# Patient Record
Sex: Female | Born: 1975 | Race: White | Hispanic: No | Marital: Married | Smoking: Never smoker
Health system: Southern US, Community
[De-identification: ages and names within clinical notes are randomized; demographics above are authoritative.]

## PROBLEM LIST (undated history)

## (undated) DIAGNOSIS — R0981 Nasal congestion: Secondary | ICD-10-CM

## (undated) DIAGNOSIS — E7800 Pure hypercholesterolemia, unspecified: Secondary | ICD-10-CM

## (undated) DIAGNOSIS — Z9189 Other specified personal risk factors, not elsewhere classified: Secondary | ICD-10-CM

## (undated) DIAGNOSIS — I10 Essential (primary) hypertension: Secondary | ICD-10-CM

## (undated) DIAGNOSIS — E559 Vitamin D deficiency, unspecified: Secondary | ICD-10-CM

## (undated) DIAGNOSIS — E785 Hyperlipidemia, unspecified: Secondary | ICD-10-CM

## (undated) DIAGNOSIS — R7303 Prediabetes: Secondary | ICD-10-CM

## (undated) HISTORY — DX: Hyperlipidemia, unspecified: E78.5

## (undated) HISTORY — DX: Prediabetes: R73.03

## (undated) HISTORY — DX: Pure hypercholesterolemia, unspecified: E78.00

## (undated) HISTORY — DX: Other specified personal risk factors, not elsewhere classified: Z91.89

## (undated) HISTORY — DX: Nasal congestion: R09.81

## (undated) HISTORY — DX: Vitamin D deficiency, unspecified: E55.9

---

## 2001-12-21 HISTORY — PX: BREAST BIOPSY: SHX20

## 2005-03-11 ENCOUNTER — Ambulatory Visit: Payer: Self-pay

## 2005-12-21 ENCOUNTER — Observation Stay: Payer: Self-pay | Admitting: Unknown Physician Specialty

## 2006-02-08 ENCOUNTER — Observation Stay: Payer: Self-pay | Admitting: Unknown Physician Specialty

## 2006-02-17 ENCOUNTER — Observation Stay: Payer: Self-pay

## 2006-03-19 ENCOUNTER — Inpatient Hospital Stay: Payer: Self-pay | Admitting: Obstetrics & Gynecology

## 2006-03-25 ENCOUNTER — Ambulatory Visit: Payer: Self-pay | Admitting: Pediatrics

## 2008-07-11 ENCOUNTER — Ambulatory Visit: Payer: Self-pay

## 2011-02-13 ENCOUNTER — Inpatient Hospital Stay: Payer: Self-pay

## 2013-12-29 LAB — HM PAP SMEAR: HM PAP: NEGATIVE

## 2014-02-05 ENCOUNTER — Other Ambulatory Visit: Payer: Self-pay | Admitting: Obstetrics and Gynecology

## 2014-02-05 DIAGNOSIS — Z803 Family history of malignant neoplasm of breast: Secondary | ICD-10-CM

## 2014-02-05 DIAGNOSIS — Z1231 Encounter for screening mammogram for malignant neoplasm of breast: Secondary | ICD-10-CM

## 2014-02-15 ENCOUNTER — Ambulatory Visit: Payer: Self-pay

## 2014-06-25 ENCOUNTER — Ambulatory Visit: Payer: Self-pay | Admitting: Obstetrics and Gynecology

## 2015-12-09 ENCOUNTER — Encounter: Payer: Self-pay | Admitting: *Deleted

## 2015-12-11 ENCOUNTER — Encounter: Payer: Self-pay | Admitting: Obstetrics and Gynecology

## 2015-12-11 ENCOUNTER — Ambulatory Visit (INDEPENDENT_AMBULATORY_CARE_PROVIDER_SITE_OTHER): Payer: BC Managed Care – PPO | Admitting: Obstetrics and Gynecology

## 2015-12-11 VITALS — BP 120/78 | HR 88 | Ht 65.0 in | Wt 208.0 lb

## 2015-12-11 DIAGNOSIS — Z01419 Encounter for gynecological examination (general) (routine) without abnormal findings: Secondary | ICD-10-CM

## 2015-12-11 NOTE — Progress Notes (Signed)
Subjective:   Katherine Callahan is a 39 y.o. 473P0 Caucasian female here for a routine well-woman exam.  Patient's last menstrual period was 11/30/2015 (exact date).    Current complaints: regained weight lost last year, also bumps on 2nd and 3rd digits on left hand getting larger- not painful PCP: me       Does need labs  Social History: Sexual: heterosexual Marital Status: married Living situation: with family Occupation: Runner, broadcasting/film/videoteacher Tobacco/alcohol: no tobacco use Illicit drugs: no history of illicit drug use  The following portions of the patient's history were reviewed and updated as appropriate: allergies, current medications, past family history, past medical history, past social history, past surgical history and problem list.  Past Medical History Past Medical History  Diagnosis Date  . Nasal congestion   . High cholesterol   . Pre-diabetes   . Vitamin D deficiency   . Increased risk of breast cancer     Past Surgical History History reviewed. No pertinent past surgical history.  Gynecologic History G3P0  Patient's last menstrual period was 11/30/2015 (exact date). Contraception: vasectomy Last Pap: 2015. Results were: normal Last mammogram: 2015. Results were: normal  Obstetric History OB History  Gravida Para Term Preterm AB SAB TAB Ectopic Multiple Living  3         3    # Outcome Date GA Lbr Len/2nd Weight Sex Delivery Anes PTL Lv  3 Gravida 2012    F Vag-Spont   Y  2 Gravida 2007    M Vag-Spont   Y  1 Gravida 2001    M Vag-Spont   Y      Current Medications Current Outpatient Prescriptions on File Prior to Visit  Medication Sig Dispense Refill  . ergocalciferol (VITAMIN D2) 50000 UNITS capsule Take 50,000 Units by mouth once a week. Reported on 12/11/2015     No current facility-administered medications on file prior to visit.    Review of Systems Patient denies any headaches, blurred vision, shortness of breath, chest pain, abdominal pain, problems with  bowel movements, urination, or intercourse.  Objective:  BP 120/78 mmHg  Pulse 88  Ht 5\' 5"  (1.651 m)  Wt 208 lb (94.348 kg)  BMI 34.61 kg/m2  LMP 11/30/2015 (Exact Date) Physical Exam  General:  Well developed, well nourished, no acute distress. She is alert and oriented x3. Skin:  Warm and dry Neck:  Midline trachea, no thyromegaly or nodules Cardiovascular: Regular rate and rhythm, no murmur heard Lungs:  Effort normal, all lung fields clear to auscultation bilaterally Breasts:  No dominant palpable mass, retraction, or nipple discharge Abdomen:  Soft, non tender, no hepatosplenomegaly or masses Pelvic:  External genitalia is normal in appearance.  The vagina is normal in appearance. The cervix is bulbous, no CMT.  Thin prep pap is not done. Uterus is felt to be normal size, shape, and contour.  No adnexal masses or tenderness noted. Extremities:  No swelling or varicosities noted Psych:  She has a normal mood and affect  Assessment:   Healthy well-woman exam Obesity Pre-diabetes Nodules on digits  Plan:  Labs obtained Discussed weight loss plan here- will consider Refer to dermatology to establish care F/U 1 year for AE, or sooner if needed Mammogram scheduled  Zacharie Portner [name] [name], CNM

## 2015-12-11 NOTE — Patient Instructions (Signed)
  Place annual gynecologic exam patient instructions here.  Thank you for enrolling in MyChart. Please follow the instructions below to securely access your online medical record. MyChart allows you to send messages to your doctor, view your test results, manage appointments, and more.   How Do I Sign Up? 1. In your Internet browser, go to [employer] Address [employer] and enter [email or web address]. 2. Click on the Sign Up Now link in the Sign In box. You will see the New Member Sign Up page. 3. Enter your MyChart Access Code exactly as it appears below. You will not need to use this code after you've completed the sign-up process. If you do not sign up before the expiration date, you must request a new code.  MyChart Access Code: [email or web address] Expires: 01/24/2016  4:01 PM  4. Enter your Social Security Number ([id number]) and Date of Birth (mm/dd/yyyy) as indicated and click Submit. You will be taken to the next sign-up page. 5. Create a MyChart ID. This will be your MyChart login ID and cannot be changed, so think of one that is secure and easy to remember. 6. Create a MyChart password. You can change your password at any time. 7. Enter your Password Reset Question and Answer. This can be used at a later time if you forget your password.  8. Enter your e-mail address. You will receive e-mail notification when new information is available in MyChart. 9. Click Sign Up. You can now view your medical record.   Additional Information Remember, MyChart is NOT to be used for urgent needs. For medical emergencies, dial 911.

## 2015-12-12 ENCOUNTER — Telehealth: Payer: Self-pay | Admitting: *Deleted

## 2015-12-12 ENCOUNTER — Other Ambulatory Visit: Payer: Self-pay | Admitting: Obstetrics and Gynecology

## 2015-12-12 DIAGNOSIS — E782 Mixed hyperlipidemia: Secondary | ICD-10-CM

## 2015-12-12 LAB — COMPREHENSIVE METABOLIC PANEL
ALBUMIN: 4.6 g/dL (ref 3.5–5.5)
ALK PHOS: 60 IU/L (ref 39–117)
ALT: 20 IU/L (ref 0–32)
AST: 17 IU/L (ref 0–40)
Albumin/Globulin Ratio: 1.7 (ref 1.1–2.5)
BUN / CREAT RATIO: 13 (ref 9–23)
BUN: 9 mg/dL (ref 6–24)
Bilirubin Total: 0.4 mg/dL (ref 0.0–1.2)
CALCIUM: 9.4 mg/dL (ref 8.7–10.2)
CO2: 23 mmol/L (ref 18–29)
CREATININE: 0.69 mg/dL (ref 0.57–1.00)
Chloride: 99 mmol/L (ref 96–106)
GFR calc Af Amer: 125 mL/min/{1.73_m2} (ref >59)
GFR, EST NON AFRICAN AMERICAN: 108 mL/min/{1.73_m2} (ref >59)
GLUCOSE: 89 mg/dL (ref 65–99)
Globulin, Total: 2.7 g/dL (ref 1.5–4.5)
Potassium: 4.2 mmol/L (ref 3.5–5.2)
Sodium: 139 mmol/L (ref 134–144)
Total Protein: 7.3 g/dL (ref 6.0–8.5)

## 2015-12-12 LAB — LIPID PANEL
CHOL/HDL RATIO: 4.4 ratio (ref 0.0–4.4)
CHOLESTEROL TOTAL: 253 mg/dL — AB (ref 100–199)
HDL: 57 mg/dL (ref >39)
LDL CALC: 153 mg/dL — AB (ref 0–99)
TRIGLYCERIDES: 214 mg/dL — AB (ref 0–149)
VLDL CHOLESTEROL CAL: 43 mg/dL — AB (ref 5–40)

## 2015-12-12 LAB — VITAMIN D 25 HYDROXY (VIT D DEFICIENCY, FRACTURES): Vit D, 25-Hydroxy: 24.8 ng/mL — ABNORMAL LOW (ref 30.0–100.0)

## 2015-12-12 LAB — HEMOGLOBIN A1C
Est. average glucose Bld gHb Est-mCnc: 114 mg/dL
Hgb A1c MFr Bld: 5.6 % (ref 4.8–5.6)

## 2015-12-12 MED ORDER — OMEGA-3 1000 MG PO CAPS: 1.0000 | ORAL_CAPSULE | Freq: Every day | ORAL | Status: DC

## 2015-12-12 MED ORDER — RED YEAST RICE 500 MG/0.5GM PO POWD: 1.0000 | Freq: Every day | ORAL | Status: DC

## 2015-12-12 MED ORDER — GARLIC 300 MG PO TABS: 1.0000 | ORAL_TABLET | Freq: Every day | ORAL | Status: DC

## 2015-12-12 NOTE — Telephone Encounter (Signed)
-----   Message from Purcell NailsMelody N Shambley, [place] sent at 12/12/2015  9:20 AM EST ----- Please let her know labs look good except vit D is still low, needs to continue on weekly supplements, especially through the winter. Also cholesterol panel was high with elevated triglycerides. High enough to need medications, but I want tot give her time to clean up diet and weight loss as we discussed, and recheck levels in 3 months (order in computer), if they don't improve with consider prescription medication then. I also sent in prescriptions for OTC natural supplements of Garlic tablets, red yeast rice tablets and Omega 3 capsules as they have been shown to reduce cholesterol I want her to take one of each daily until she gets labs drawn in three months.

## 2015-12-12 NOTE — Telephone Encounter (Signed)
Notified pt of results, she is aware and is going to start dieting and exercising

## 2016-01-23 ENCOUNTER — Ambulatory Visit (INDEPENDENT_AMBULATORY_CARE_PROVIDER_SITE_OTHER): Payer: BC Managed Care – PPO | Admitting: Obstetrics and Gynecology

## 2016-01-23 ENCOUNTER — Encounter: Payer: Self-pay | Admitting: Obstetrics and Gynecology

## 2016-01-23 VITALS — BP 122/78 | HR 98 | Temp 99.1°F | Ht 66.0 in | Wt 213.0 lb

## 2016-01-23 DIAGNOSIS — J209 Acute bronchitis, unspecified: Secondary | ICD-10-CM

## 2016-01-23 MED ORDER — ALBUTEROL SULFATE HFA 108 (90 BASE) MCG/ACT IN AERS: 2.0000 | INHALATION_SPRAY | Freq: Four times a day (QID) | RESPIRATORY_TRACT | Status: DC | PRN

## 2016-01-23 MED ORDER — CEFDINIR 300 MG PO CAPS: 300.0000 mg | ORAL_CAPSULE | Freq: Two times a day (BID) | ORAL | Status: DC

## 2016-01-23 NOTE — Patient Instructions (Signed)

## 2016-01-23 NOTE — Progress Notes (Signed)
Subjective:     Patient ID: Katherine Callahan, female   DOB: [birth year], 40 y.o.   MRN: [id number]  HPI Reports sudden onset productive cough and generalized fatigue 3 days ago, now with dry cough, postnasal drip and rib pain in upper right back from coughing. Denies fever.  Review of Systems See above    Objective:   Physical Exam A&O x4 Well groomed female HRR Lungs with bilateral rhonchi- worse on right Negative lymphadenopathy    Assessment:     Acute bronchitis     Plan:     Rest & push fluids. RX for [employer] ProAir inhaler sent in RTC prn  Oanh Devivo [place], CNM

## 2016-03-31 ENCOUNTER — Ambulatory Visit: Payer: Self-pay

## 2016-04-01 ENCOUNTER — Ambulatory Visit
Admission: RE | Admit: 2016-04-01 | Discharge: 2016-04-01 | Disposition: A | Discharge status: Home / Self Care | Payer: BC Managed Care – PPO | Source: Ambulatory Visit | Attending: Obstetrics and Gynecology | Admitting: Obstetrics and Gynecology

## 2016-04-01 DIAGNOSIS — Z1231 Encounter for screening mammogram for malignant neoplasm of breast: Secondary | ICD-10-CM | POA: Insufficient documentation

## 2016-04-01 DIAGNOSIS — Z01419 Encounter for gynecological examination (general) (routine) without abnormal findings: Secondary | ICD-10-CM

## 2016-04-02 ENCOUNTER — Other Ambulatory Visit: Payer: Self-pay | Admitting: Obstetrics and Gynecology

## 2016-04-02 DIAGNOSIS — R928 Other abnormal and inconclusive findings on diagnostic imaging of breast: Secondary | ICD-10-CM

## 2016-04-07 ENCOUNTER — Ambulatory Visit
Admission: RE | Admit: 2016-04-07 | Discharge: 2016-04-07 | Disposition: A | Discharge status: Home / Self Care | Payer: BC Managed Care – PPO | Source: Ambulatory Visit | Attending: Obstetrics and Gynecology | Admitting: Obstetrics and Gynecology

## 2016-04-07 DIAGNOSIS — R928 Other abnormal and inconclusive findings on diagnostic imaging of breast: Secondary | ICD-10-CM

## 2016-04-07 DIAGNOSIS — N6002 Solitary cyst of left breast: Secondary | ICD-10-CM | POA: Insufficient documentation

## 2016-04-09 ENCOUNTER — Other Ambulatory Visit: Payer: BC Managed Care – PPO

## 2016-04-09 ENCOUNTER — Ambulatory Visit: Payer: BC Managed Care – PPO

## 2016-05-19 ENCOUNTER — Other Ambulatory Visit: Payer: Self-pay | Admitting: *Deleted

## 2016-05-19 ENCOUNTER — Telehealth: Payer: Self-pay | Admitting: Obstetrics and Gynecology

## 2016-05-19 MED ORDER — SCOPOLAMINE 1 MG/3DAYS TD PT72: 1.0000 | MEDICATED_PATCH | TRANSDERMAL | Status: DC

## 2016-05-19 NOTE — Telephone Encounter (Signed)
Pt is going on a cruise and wants the patch for motion sickness sent to [facility] [place]

## 2016-05-19 NOTE — Telephone Encounter (Signed)
Done-ac 

## 2016-05-29 ENCOUNTER — Telehealth: Payer: Self-pay | Admitting: *Deleted

## 2016-05-29 ENCOUNTER — Other Ambulatory Visit: Payer: Self-pay | Admitting: Obstetrics and Gynecology

## 2016-05-29 MED ORDER — AZITHROMYCIN 250 MG PO TABS: ORAL_TABLET | ORAL | Status: DC

## 2016-05-29 NOTE — Telephone Encounter (Signed)
Patient called and states that her son had strep throat last week and she is complaining of a sore throat. Patient is leaving for her cruise today and is wondering if Melody can call in a Z pack for her. [facility] [place]. Thanks

## 2016-05-29 NOTE — Telephone Encounter (Signed)
done

## 2016-12-08 ENCOUNTER — Other Ambulatory Visit: Payer: Self-pay | Admitting: *Deleted

## 2016-12-08 ENCOUNTER — Telehealth: Payer: Self-pay | Admitting: Obstetrics and Gynecology

## 2016-12-08 MED ORDER — AZITHROMYCIN 250 MG PO TABS: ORAL_TABLET | ORAL | 1 refills | Status: DC

## 2016-12-08 NOTE — Telephone Encounter (Signed)
PT CALLED AND REQUESTED A CALL BACK FROM YOU/

## 2016-12-08 NOTE — Telephone Encounter (Signed)
Spoke with pt

## 2016-12-16 ENCOUNTER — Encounter: Payer: Self-pay | Admitting: Obstetrics and Gynecology

## 2016-12-16 ENCOUNTER — Ambulatory Visit (INDEPENDENT_AMBULATORY_CARE_PROVIDER_SITE_OTHER): Payer: BC Managed Care – PPO | Admitting: Obstetrics and Gynecology

## 2016-12-16 VITALS — BP 120/84 | HR 75 | Ht 66.0 in | Wt 213.3 lb

## 2016-12-16 DIAGNOSIS — Z23 Encounter for immunization: Secondary | ICD-10-CM | POA: Diagnosis not present

## 2016-12-16 DIAGNOSIS — Z01419 Encounter for gynecological examination (general) (routine) without abnormal findings: Secondary | ICD-10-CM | POA: Diagnosis not present

## 2016-12-16 NOTE — Patient Instructions (Addendum)
 Preventive Care 18-39 Years, Female Preventive care refers to lifestyle choices and visits with your health care provider that can promote health and wellness. What does preventive care include?  A yearly physical exam. This is also called an annual well check.  Dental exams once or twice a year.  Routine eye exams. Ask your health care provider how often you should have your eyes checked.  Personal lifestyle choices, including:  Daily care of your teeth and gums.  Regular physical activity.  Eating a healthy diet.  Avoiding tobacco and drug use.  Limiting alcohol use.  Practicing safe sex.  Taking vitamin and mineral supplements as recommended by your health care provider. What happens during an annual well check? The services and screenings done by your health care provider during your annual well check will depend on your age, overall health, lifestyle risk factors, and family history of disease. Counseling  Your health care provider may ask you questions about your:  Alcohol use.  Tobacco use.  Drug use.  Emotional well-being.  Home and relationship well-being.  Sexual activity.  Eating habits.  Work and work environment.  Method of birth control.  Menstrual cycle.  Pregnancy history. Screening  You may have the following tests or measurements:  Height, weight, and BMI.  Diabetes screening. This is done by checking your blood sugar (glucose) after you have not eaten for a while (fasting).  Blood pressure.  Lipid and cholesterol levels. These may be checked every 5 years starting at age 20.  Skin check.  Hepatitis C blood test.  Hepatitis B blood test.  Sexually transmitted disease (STD) testing.  BRCA-related cancer screening. This may be done if you have a family history of breast, ovarian, tubal, or peritoneal cancers.  Pelvic exam and Pap test. This may be done every 3 years starting at age 21. Starting at age 30, this may be done  every 5 years if you have a Pap test in combination with an HPV test. Discuss your test results, treatment options, and if necessary, the need for more tests with your health care provider. Vaccines  Your health care provider may recommend certain vaccines, such as:  Influenza vaccine. This is recommended every year.  Tetanus, diphtheria, and acellular pertussis (Tdap, Td) vaccine. You may need a Td booster every 10 years.  Varicella vaccine. You may need this if you have not been vaccinated.  HPV vaccine. If you are 26 or younger, you may need three doses over 6 months.  Measles, mumps, and rubella (MMR) vaccine. You may need at least one dose of MMR. You may also need a second dose.  Pneumococcal 13-valent conjugate (PCV13) vaccine. You may need this if you have certain conditions and were not previously vaccinated.  Pneumococcal polysaccharide (PPSV23) vaccine. You may need one or two doses if you smoke cigarettes or if you have certain conditions.  Meningococcal vaccine. One dose is recommended if you are age 19-21 years and a first-year college student living in a residence hall, or if you have one of several medical conditions. You may also need additional booster doses.  Hepatitis A vaccine. You may need this if you have certain conditions or if you travel or work in places where you may be exposed to hepatitis A.  Hepatitis B vaccine. You may need this if you have certain conditions or if you travel or work in places where you may be exposed to hepatitis B.  Haemophilus influenzae type b (Hib) vaccine. You may need   this if you have certain risk factors. Talk to your health care provider about which screenings and vaccines you need and how often you need them. This information is not intended to replace advice given to you by your health care provider. Make sure you discuss any questions you have with your health care provider. Document Released: 02/02/2002 Document Revised:  08/26/2016 Document Reviewed: 10/08/2015 Elsevier Interactive Patient Education  2017 [place] you for enrolling in [place]. Please follow the instructions below to securely access your online medical record. MyChart allows you to send messages to your doctor, view your test results, renew your prescriptions, schedule appointments, and more.  How Do I Sign Up? 1. In your Internet browser, go to [email or web address]. 2. Click on the New  User? link in the Sign In box.  3. Enter your MyChart Access Code exactly as it appears below. You will not need to use this code after you have completed the sign-up process. If you do not sign up before the expiration date, you must request a new code. MyChart Access Code: [email or web address] Expires: 02/14/2017  8:24 AM  4. Enter the last four digits of your Social Security Number (xxxx) and Date of Birth (mm/dd/yyyy) as indicated and click Next. You will be taken to the next sign-up page. 5. Create a MyChart ID. This will be your MyChart login ID and cannot be changed, so think of one that is secure and easy to remember. 6. Create a MyChart password. You can change your password at any time. 7. Enter your Password Reset Question and Answer and click Next. This can be used at a later time if you forget your password.  8. Select your communication preference, and if applicable enter your e-mail address. You will receive e-mail notification when new information is available in MyChart by choosing to receive e-mail notifications and filling in your e-mail. 9. Click Sign In. You can now view your medical record.   Additional Information If you have questions, you can email [email or web address] or call [phone] to talk to our [place] staff. Remember, MyChart is NOT to be used for urgent needs. For medical emergencies, dial 911.

## 2016-12-16 NOTE — Progress Notes (Signed)
Subjective:   Katherine Callahan is a 40 y.o. 193P0 Caucasian female here for a routine well-woman exam.  Patient's last menstrual period was 12/06/2016.    Current complaints: tired all the time, desires restart of weight loss program PCP: me       does desire labs  Social History: Sexual: heterosexual Marital Status: married Living situation: with family Occupation: Runner, broadcasting/film/videoteacher Tobacco/alcohol: no tobacco use Illicit drugs: no history of illicit drug use  The following portions of the patient's history were reviewed and updated as appropriate: allergies, current medications, past family history, past medical history, past social history, past surgical history and problem list.  Past Medical History Past Medical History:  Diagnosis Date  . High cholesterol   . Increased risk of breast cancer   . Nasal congestion   . Pre-diabetes   . Vitamin D deficiency     Past Surgical History Past Surgical History:  Procedure Laterality Date  . BREAST BIOPSY     neg    Gynecologic History G3P0  Patient's last menstrual period was 12/06/2016. Contraception: tubal ligation Last Pap: 2015. Results were: normal Last mammogram: 2017. Results were: abnormal   Obstetric History OB History  Gravida Para Term Preterm AB Living  3         3  SAB TAB Ectopic Multiple Live Births          3    # Outcome Date GA Lbr Len/2nd Weight Sex Delivery Anes PTL Lv  3 Gravida 2012    F Vag-Spont   LIV  2 Gravida 2007    M Vag-Spont   LIV  1 Gravida 2001    M Vag-Spont   LIV      Current Medications Current Outpatient Prescriptions on File Prior to Visit  Medication Sig Dispense Refill  . azithromycin (ZITHROMAX) 250 MG tablet Take as directed: Two pills by mouth the first [place], then one pill every [place] until completed (Patient not taking: Reported on 12/16/2016) 6 tablet 1  . cefdinir (OMNICEF) 300 MG capsule Take 1 capsule (300 mg total) by mouth 2 (two) times daily. (Patient not taking: Reported on  12/16/2016) 14 capsule 1  . ergocalciferol (VITAMIN D2) 50000 UNITS capsule Take 50,000 Units by mouth once a week. Reported on 12/11/2015    . Garlic 300 MG TABS Take 1 tablet (300 mg total) by mouth daily. (Patient not taking: Reported on 12/16/2016) 120 each 1  . Omega-3 1000 MG CAPS Take 1 capsule (1,000 mg total) by mouth daily. (Patient not taking: Reported on 12/16/2016) 120 capsule 1  . Red Yeast Rice 500 MG/0.5GM POWD Take 1 capsule by mouth daily. (Patient not taking: Reported on 12/16/2016) 120 Bottle 1  . scopolamine (TRANSDERM-SCOP, 1.5 MG,) 1 MG/3DAYS Place 1 patch (1.5 mg total) onto the skin every 3 (three) days. (Patient not taking: Reported on 12/16/2016) 5 patch 0   No current facility-administered medications on file prior to visit.     Review of Systems Patient denies any headaches, blurred vision, shortness of breath, chest pain, abdominal pain, problems with bowel movements, urination, or intercourse.  Objective:  BP 120/84   Pulse 75   Ht 5\' 6"  (1.676 m)   Wt 213 lb 4.8 oz (96.8 kg)   LMP 12/06/2016   BMI 34.43 kg/m  Physical Exam  General:  Well developed, well nourished, no acute distress. She is alert and oriented x3. Skin:  Warm and dry Neck:  Midline trachea, no thyromegaly or nodules Cardiovascular: Regular  rate and rhythm, no murmur heard Lungs:  Effort normal, all lung fields clear to auscultation bilaterally Breasts:  No dominant palpable mass, retraction, or nipple discharge Abdomen:  Soft, non tender, no hepatosplenomegaly or masses Pelvic:  External genitalia is normal in appearance.  The vagina is normal in appearance. The cervix is bulbous, no CMT.  Thin prep pap is not done. Uterus is felt to be normal size, shape, and contour.  No adnexal masses or tenderness noted. Extremities:  No swelling or varicosities noted Psych:  She has a normal mood and affect  Assessment:   Healthy well-woman exam Needs flu vaccine Elevated cholesterol Vit D  deficiency Obesity   Plan:  Returning tomorrow for labs and to restart weight loss program F/U 1 year for AE, or sooner if needed Mammogram ordered  Melody [name] Shambley, CNM

## 2016-12-17 ENCOUNTER — Other Ambulatory Visit: Payer: BC Managed Care – PPO

## 2016-12-18 ENCOUNTER — Other Ambulatory Visit: Payer: Self-pay | Admitting: Obstetrics and Gynecology

## 2016-12-18 DIAGNOSIS — R7989 Other specified abnormal findings of blood chemistry: Secondary | ICD-10-CM

## 2016-12-18 LAB — COMPREHENSIVE METABOLIC PANEL
ALK PHOS: 68 IU/L (ref 39–117)
ALT: 25 IU/L (ref 0–32)
AST: 20 IU/L (ref 0–40)
Albumin/Globulin Ratio: 1.5 (ref 1.2–2.2)
Albumin: 4.2 g/dL (ref 3.5–5.5)
BILIRUBIN TOTAL: 0.5 mg/dL (ref 0.0–1.2)
BUN/Creatinine Ratio: 15 (ref 9–23)
BUN: 11 mg/dL (ref 6–24)
CHLORIDE: 100 mmol/L (ref 96–106)
CO2: 24 mmol/L (ref 18–29)
Calcium: 9.3 mg/dL (ref 8.7–10.2)
Creatinine, Ser: 0.74 mg/dL (ref 0.57–1.00)
GFR calc Af Amer: 116 mL/min/{1.73_m2} (ref >59)
GFR calc non Af Amer: 100 mL/min/{1.73_m2} (ref >59)
GLUCOSE: 88 mg/dL (ref 65–99)
Globulin, Total: 2.8 g/dL (ref 1.5–4.5)
Potassium: 4.4 mmol/L (ref 3.5–5.2)
Sodium: 139 mmol/L (ref 134–144)
Total Protein: 7 g/dL (ref 6.0–8.5)

## 2016-12-18 LAB — THYROID PANEL WITH TSH
FREE THYROXINE INDEX: 1.3 (ref 1.2–4.9)
T3 UPTAKE RATIO: 21 % — AB (ref 24–39)
T4 TOTAL: 6.4 ug/dL (ref 4.5–12.0)
TSH: 5.51 u[IU]/mL — ABNORMAL HIGH (ref 0.450–4.500)

## 2016-12-18 LAB — CBC
Hematocrit: 44.1 % (ref 34.0–46.6)
Hemoglobin: 14.8 g/dL (ref 11.1–15.9)
MCH: 29.7 pg (ref 26.6–33.0)
MCHC: 33.6 g/dL (ref 31.5–35.7)
MCV: 89 fL (ref 79–97)
PLATELETS: 259 10*3/uL (ref 150–379)
RBC: 4.98 x10E6/uL (ref 3.77–5.28)
RDW: 13.6 % (ref 12.3–15.4)
WBC: 6.8 10*3/uL (ref 3.4–10.8)

## 2016-12-18 LAB — NMR, LIPOPROFILE
Cholesterol: 253 mg/dL — ABNORMAL HIGH (ref 100–199)
HDL CHOLESTEROL BY NMR: 57 mg/dL (ref >39)
HDL Particle Number: 43.7 umol/L (ref >=30.5)
LDL PARTICLE NUMBER: 2082 nmol/L — AB (ref <1000)
LDL Size: 20.7 nm (ref >20.5)
LDL-C: 141 mg/dL — AB (ref 0–99)
LP-IR Score: 79 — ABNORMAL HIGH (ref <=45)
Small LDL Particle Number: 1073 nmol/L — ABNORMAL HIGH (ref <=527)
TRIGLYCERIDES BY NMR: 275 mg/dL — AB (ref 0–149)

## 2016-12-18 LAB — VITAMIN B12: VITAMIN B 12: 333 pg/mL (ref 232–1245)

## 2016-12-18 LAB — HEMOGLOBIN A1C
Est. average glucose Bld gHb Est-mCnc: 111 mg/dL
HEMOGLOBIN A1C: 5.5 % (ref 4.8–5.6)

## 2016-12-18 LAB — VITAMIN D 25 HYDROXY (VIT D DEFICIENCY, FRACTURES): Vit D, 25-Hydroxy: 23.4 ng/mL — ABNORMAL LOW (ref 30.0–100.0)

## 2016-12-18 MED ORDER — ROSUVASTATIN CALCIUM 10 MG PO TABS: 10.0000 mg | ORAL_TABLET | Freq: Every day | ORAL | 6 refills | Status: DC

## 2017-01-04 ENCOUNTER — Telehealth: Payer: Self-pay | Admitting: Obstetrics and Gynecology

## 2017-01-04 NOTE — Telephone Encounter (Signed)
Pt called and she never got a call back about her blood work, she stated she does not have my chart and then her pharmacy called her and said her crestor was ready, and she is wanting to know the results about her blood.

## 2017-01-08 NOTE — Telephone Encounter (Signed)
-----   Message from Purcell NailsMelody N Shambley, [place] sent at 12/18/2016 11:47 AM EST ----- See note

## 2017-01-08 NOTE — Telephone Encounter (Signed)
Done-ac 

## 2017-01-08 NOTE — Telephone Encounter (Signed)
Notified pt of results 

## 2017-01-22 ENCOUNTER — Other Ambulatory Visit (INDEPENDENT_AMBULATORY_CARE_PROVIDER_SITE_OTHER): Payer: BC Managed Care – PPO

## 2017-01-22 ENCOUNTER — Ambulatory Visit (INDEPENDENT_AMBULATORY_CARE_PROVIDER_SITE_OTHER): Payer: BC Managed Care – PPO | Admitting: Internal Medicine

## 2017-01-22 ENCOUNTER — Encounter: Payer: Self-pay | Admitting: Internal Medicine

## 2017-01-22 VITALS — BP 112/80 | HR 97 | Ht 65.5 in | Wt 214.0 lb

## 2017-01-22 DIAGNOSIS — R7989 Other specified abnormal findings of blood chemistry: Secondary | ICD-10-CM

## 2017-01-22 DIAGNOSIS — R946 Abnormal results of thyroid function studies: Secondary | ICD-10-CM

## 2017-01-22 LAB — T4, FREE: FREE T4: 0.52 ng/dL — AB (ref 0.60–1.60)

## 2017-01-22 LAB — T3, FREE: T3, Free: 4.5 pg/mL — ABNORMAL HIGH (ref 2.3–4.2)

## 2017-01-22 LAB — TSH: TSH: 3.18 u[IU]/mL (ref 0.35–4.50)

## 2017-01-22 NOTE — Patient Instructions (Signed)
[facility]  If we need to start thyroid hormone, take the thyroid hormone every [place], with water, at least 30 minutes before breakfast, separated by at least 4 hours from: - acid reflux medications - calcium - iron - multivitamins  Please come back for a follow-up appointment in 4 months.

## 2017-01-22 NOTE — Progress Notes (Signed)
Patient ID: Katherine Callahan, female   DOB: 01/17/1976, 41 y.o.   MRN: [id number]    HPI  Katherine Callahan is a 41 y.o.-year-old female, referred by Katherine Callahan ([facility]), for management of elevated TSH.  I reviewed pt's thyroid tests: Lab Results  Component Value Date   TSH 5.510 (H) 12/17/2016    Pt describes: - + weight gain, but lost weight 30 lbs 3 years ago. - + fatigue - no cold intolerance - no depression - + occas.constipation - no dry skin - + chronic hair loss - worse  She is exercises 30 min 2x a week.   Pt denies feeling nodules in neck, hoarseness, dysphagia/odynophagia, SOB with lying down.  She has no FH of thyroid disorders. No FH of thyroid cancer.  No h/o radiation tx to head or neck. No recent use of iodine supplements.  Pt. also has a history of HL, migraines - uses peppermint oil.  ROS: Constitutional: + see HPI Eyes: no blurry vision, no xerophthalmia ENT: no sore throat, no nodules palpated in throat, no dysphagia/odynophagia, no hoarseness Cardiovascular: no CP/SOB/palpitations/leg swelling Respiratory: no cough/SOB Gastrointestinal: no N/V/D/C/+ heartburn Musculoskeletal: no muscle/joint aches Skin: no rashes Neurological: no tremors/numbness/tingling/dizziness, + HA Psychiatric: no depression/anxiety + low libido  Past Medical History:  Diagnosis Date  . High cholesterol   . Increased risk of breast cancer   . Nasal congestion   . Pre-diabetes   . Vitamin D deficiency    Past Surgical History:  Procedure Laterality Date  . BREAST BIOPSY     neg   Social History   Social History  . Marital status: Married    Spouse name: N/A  . Number of children: 3   Occupational History  . teacher   Social History Main Topics  . Smoking status: Never Smoker  . Smokeless tobacco: Never Used  . Alcohol use No  . Drug use: No  . Sexual activity: Yes     Comment: husband-vasectomy   Current Outpatient Prescriptions on File Prior to  Visit  Medication Sig Dispense Refill  . ergocalciferol (VITAMIN D2) 50000 UNITS capsule Take 50,000 Units by mouth once a week. Reported on 12/11/2015    . cefdinir (OMNICEF) 300 MG capsule Take 1 capsule (300 mg total) by mouth 2 (two) times daily. (Patient not taking: Reported on 12/16/2016) 14 capsule 1  . Garlic 300 MG TABS Take 1 tablet (300 mg total) by mouth daily. (Patient not taking: Reported on 12/16/2016) 120 each 1  . Omega-3 1000 MG CAPS Take 1 capsule (1,000 mg total) by mouth daily. (Patient not taking: Reported on 12/16/2016) 120 capsule 1  . Red Yeast Rice 500 MG/0.5GM POWD Take 1 capsule by mouth daily. (Patient not taking: Reported on 12/16/2016) 120 Bottle 1  . rosuvastatin (CRESTOR) 10 MG tablet Take 1 tablet (10 mg total) by mouth daily. (Patient not taking: Reported on 01/22/2017) 30 tablet 6  . scopolamine (TRANSDERM-SCOP, 1.5 MG,) 1 MG/3DAYS Place 1 patch (1.5 mg total) onto the skin every 3 (three) days. (Patient not taking: Reported on 12/16/2016) 5 patch 0   No current facility-administered medications on file prior to visit.    Allergies  Allergen Reactions  . Aspirin   . Codeine   . Sulfa Antibiotics   . Penicillin G Rash   Family History  Problem Relation Age of Onset  . Breast cancer Other   . Cancer Mother     breast  . Breast cancer Mother 15  .  Heart disease Father     PE: BP 112/80 (BP Location: Left Arm, Patient Position: Sitting)   Pulse 97   Ht 5' 5.5" (1.664 m)   Wt 214 lb (97.1 kg)   LMP 12/31/2016   SpO2 97%   BMI 35.07 kg/m  Wt Readings from Last 3 Encounters:  01/22/17 214 lb (97.1 kg)  12/16/16 213 lb 4.8 oz (96.8 kg)  01/23/16 213 lb (96.6 kg)   Constitutional: overweight, in NAD Eyes: PERRLA, EOMI, no exophthalmos ENT: moist mucous membranes, no thyromegaly, no cervical lymphadenopathy Cardiovascular: RRR, No MRG Respiratory: CTA B Gastrointestinal: abdomen soft, NT, ND, BS+ Musculoskeletal: no deformities, strength intact  in all 4 Skin: moist, warm, no rashes Neurological: no tremor with outstretched hands, DTR normal in all 4  ASSESSMENT: 1. High TSH  PLAN:  1. Patient with newly found high TSH level, on annual screening labs. Retrospectively, patient does notice fatigue and difficulty to lose weight. - we had a long discussion about her what the high TSH level means, we discussed about the pituitary-thyroid axis and also possible causes for hypothyroidism. I advised her that Hashimoto thyroiditis is the most common cause for hypothyroidism in [place] and it is possible that she has this. I explained that this is an autoimmune disorder, in which she develops antibodies against her own thyroid. The antibodies bind to the thyroid tissue and cause inflammation, and, eventually, destruction of the gland and hypothyroidism. We don't know how long this process can be, it can last from months to years. We will check her thyroid function tests today, and I will also add thyroid antibody levels to screen her for Hashimoto's disease - We did discuss about subclinical hypothyroidism: Definition, possible symptoms, and treatment criteria. I explained that this is a condition that is usually treated if the TSH is higher than 10, if the patient desires a pregnancy, or if the TSH is above normal but the patient has signs and symptoms of hypothyroidism. Since she does have fatigue and difficulty losing weight, she feels that she may benefit from a low dose of levothyroxine. - We discussed about how to take the medicine correctly if we need to start it. I advised her to take the thyroid hormone every [place], with water, at least 30 minutes before breakfast, separated by at least 4 hours from: - acid reflux medications - calcium - iron - multivitamins - I advised pt to join my chart and I will send her the results through there   Orders Placed This Encounter  Procedures  . TSH  . T4, free  . T3, free  . Thyroid peroxidase antibody  .  Thyroglobulin antibody   Component     Latest Ref Rng & Units 12/17/2016 01/22/2017  TSH     0.35 - 4.50 uIU/mL 5.510 (H) 3.18  T4,Free(Direct)     0.60 - 1.60 ng/dL  [id number] (L)  Triiodothyronine,Free,Serum     2.3 - 4.2 pg/mL  4.5 (H)  Thyroperoxidase Ab SerPl-aCnc     <9 IU/mL  259 (H)  Thyroglobulin Ab     <2 IU/mL  1   Thyroid antibodies are elevated, consistent with Hashimoto thyroiditis. The TSH is now normal with a slightly low free T4 and a slightly high free T3. Therefore, I would recommend to recheck the tests in 1.5 months and decide about starting treatment then.  Carlus Pavlovristina Heavenleigh Petruzzi, MD PhD [facility]

## 2017-01-23 LAB — THYROID PEROXIDASE ANTIBODY: Thyroperoxidase Ab SerPl-aCnc: 259 IU/mL — ABNORMAL HIGH (ref <9)

## 2017-01-23 LAB — THYROGLOBULIN ANTIBODY: THYROGLOBULIN AB: 1 [IU]/mL (ref <2)

## 2017-01-26 ENCOUNTER — Telehealth: Payer: Self-pay

## 2017-01-26 NOTE — Telephone Encounter (Signed)
-----   Message from Carlus Pavlovristina Gherghe, MD sent at 01/25/2017 12:55 PM EST ----- Raynelle FanningJulie, can you please call pt: Katherine Callahan are elevated, consistent with Hashimoto thyroiditis. The TSH is now normal with a slightly low free T4 and a slightly high free T3. This is an unusual picture, without clear hypo-thyroidism, and therefore, I would recommend to recheck the tests in 1.5 months and decide about starting treatment then. Labs are in.

## 2017-01-26 NOTE — Telephone Encounter (Signed)
Called and LVM for patient to call back to receive lab results. Gave call back number.

## 2017-01-27 ENCOUNTER — Other Ambulatory Visit: Payer: Self-pay

## 2017-01-27 DIAGNOSIS — E063 Autoimmune thyroiditis: Secondary | ICD-10-CM

## 2017-02-09 ENCOUNTER — Telehealth: Payer: Self-pay

## 2017-02-09 ENCOUNTER — Other Ambulatory Visit: Payer: Self-pay | Admitting: Obstetrics and Gynecology

## 2017-02-09 NOTE — Telephone Encounter (Signed)
Called home phone, LVM for patient to call back to receive lab results and to make appointment in 1.5 month.

## 2017-02-09 NOTE — Telephone Encounter (Signed)
-----   Message from Cristina Gherghe, MD sent at 01/25/2017 12:55 PM EST ----- Rolinda Impson, can you please call pt: Thyroid antibodies are elevated, consistent with Hashimoto thyroiditis. The TSH is now normal with a slightly low free T4 and a slightly high free T3. This is an unusual picture, without clear hypo-thyroidism, and therefore, I would recommend to recheck the tests in 1.5 months and decide about starting treatment then. Labs are in. 

## 2017-03-03 ENCOUNTER — Telehealth: Payer: Self-pay

## 2017-03-03 NOTE — Telephone Encounter (Signed)
-----   Message from Carlus Pavlovristina Gherghe, MD sent at 01/25/2017 12:55 PM EST ----- Raynelle FanningJulie, can you please call pt: Thyroid antibodies are elevated, consistent with Hashimoto thyroiditis. The TSH is now normal with a slightly low free T4 and a slightly high free T3. This is an unusual picture, without clear hypo-thyroidism, and therefore, I would recommend to recheck the tests in 1.5 months and decide about starting treatment then. Labs are in.

## 2017-03-03 NOTE — Telephone Encounter (Signed)
Called and spoke with husband, advised that we had tried to contact her multiple times, and LVM. He stated he would take a message and get her to call back regarding lab results.

## 2017-03-03 NOTE — Telephone Encounter (Signed)
Called patient and gave lab results. Patient had no questions or concerns.  

## 2017-03-04 ENCOUNTER — Other Ambulatory Visit: Payer: BC Managed Care – PPO

## 2017-03-04 ENCOUNTER — Other Ambulatory Visit: Payer: Self-pay | Admitting: Internal Medicine

## 2017-03-05 ENCOUNTER — Encounter: Payer: Self-pay | Admitting: Internal Medicine

## 2017-03-05 ENCOUNTER — Other Ambulatory Visit: Payer: Self-pay | Admitting: Internal Medicine

## 2017-03-05 DIAGNOSIS — E039 Hypothyroidism, unspecified: Secondary | ICD-10-CM

## 2017-03-05 DIAGNOSIS — E038 Other specified hypothyroidism: Secondary | ICD-10-CM

## 2017-03-05 DIAGNOSIS — E063 Autoimmune thyroiditis: Secondary | ICD-10-CM

## 2017-03-05 LAB — TSH: TSH: 5.84 u[IU]/mL — AB (ref 0.450–4.500)

## 2017-03-05 LAB — T4, FREE: Free T4: 0.9 ng/dL (ref 0.82–1.77)

## 2017-03-05 LAB — T3, FREE: T3 FREE: 3 pg/mL (ref 2.0–4.4)

## 2017-03-08 ENCOUNTER — Other Ambulatory Visit: Payer: Self-pay

## 2017-03-08 DIAGNOSIS — R7989 Other specified abnormal findings of blood chemistry: Secondary | ICD-10-CM

## 2017-03-08 MED ORDER — LEVOTHYROXINE SODIUM 25 MCG PO TABS: 25.0000 ug | ORAL_TABLET | Freq: Every day | ORAL | 2 refills | Status: DC

## 2017-06-01 ENCOUNTER — Telehealth: Payer: Self-pay | Admitting: Internal Medicine

## 2017-06-01 ENCOUNTER — Telehealth: Payer: Self-pay

## 2017-06-01 NOTE — Telephone Encounter (Signed)
Called patient and advised her to go have labs drawn and then we would get the results and let her know if medication needs to stay the same. Patient understood.  

## 2017-06-01 NOTE — Telephone Encounter (Signed)
Please advise. Thank you

## 2017-06-01 NOTE — Telephone Encounter (Signed)
ok 

## 2017-06-01 NOTE — Telephone Encounter (Signed)
Called patient and advised her to go have labs drawn and then we would get the results and let her know if medication needs to stay the same. Patient understood.

## 2017-06-01 NOTE — Telephone Encounter (Signed)
Patient had to reschedule her appointment scheduled for tomorrow to 08-25-17 at 11am due to work. Patient needed to know if Dr. Elvera LennoxGherghe wants her to have labs done before then and if she would still refill her levothyroxine (SYNTHROID, LEVOTHROID) 25 MCG tablet before she leaves this week. Call patient to advise today. Patient is awaiting call. Okay to leave a detailed message on mobile.

## 2017-06-01 NOTE — Telephone Encounter (Signed)
yes

## 2017-06-01 NOTE — Telephone Encounter (Signed)
Should the patient have labs done?

## 2017-06-02 ENCOUNTER — Other Ambulatory Visit: Payer: BC Managed Care – PPO

## 2017-06-02 ENCOUNTER — Ambulatory Visit: Payer: BC Managed Care – PPO | Admitting: Internal Medicine

## 2017-06-02 DIAGNOSIS — E7800 Pure hypercholesterolemia, unspecified: Secondary | ICD-10-CM

## 2017-06-06 LAB — NMR, LIPOPROFILE
CHOLESTEROL: 234 mg/dL — AB (ref 100–199)
HDL Cholesterol by NMR: 45 mg/dL (ref >39)
HDL Particle Number: 39.2 umol/L (ref >=30.5)
LDL Particle Number: 2353 nmol/L — ABNORMAL HIGH (ref <1000)
LDL SIZE: 20.1 nm (ref >20.5)
LDL-C: 138 mg/dL — ABNORMAL HIGH (ref 0–99)
LP-IR Score: 91 — ABNORMAL HIGH (ref <=45)
SMALL LDL PARTICLE NUMBER: 1426 nmol/L — AB (ref <=527)
TRIGLYCERIDES BY NMR: 253 mg/dL — AB (ref 0–149)

## 2017-06-06 LAB — VITAMIN D 1,25 DIHYDROXY
VITAMIN D3 1, 25 (OH): 37 pg/mL
Vitamin D 1, 25 (OH)2 Total: 38 pg/mL
Vitamin D2 1, 25 (OH)2: <10 pg/mL

## 2017-06-21 ENCOUNTER — Other Ambulatory Visit: Payer: Self-pay | Admitting: Internal Medicine

## 2017-06-25 ENCOUNTER — Other Ambulatory Visit: Payer: Self-pay | Admitting: Obstetrics and Gynecology

## 2017-06-25 DIAGNOSIS — Z1231 Encounter for screening mammogram for malignant neoplasm of breast: Secondary | ICD-10-CM

## 2017-07-14 ENCOUNTER — Ambulatory Visit
Admission: RE | Admit: 2017-07-14 | Discharge: 2017-07-14 | Disposition: A | Discharge status: Home / Self Care | Payer: BC Managed Care – PPO | Source: Ambulatory Visit | Attending: Obstetrics and Gynecology | Admitting: Obstetrics and Gynecology

## 2017-07-14 DIAGNOSIS — Z1231 Encounter for screening mammogram for malignant neoplasm of breast: Secondary | ICD-10-CM | POA: Diagnosis not present

## 2017-08-24 ENCOUNTER — Telehealth: Payer: Self-pay | Admitting: Internal Medicine

## 2017-08-24 NOTE — Telephone Encounter (Signed)
Routing to you °

## 2017-08-24 NOTE — Telephone Encounter (Signed)
Will check TFTs at the time of the visit and see from there. OK to stay off the med for now.

## 2017-08-24 NOTE — Telephone Encounter (Signed)
Patient has been off medication. States she had to reschedule appointment for November, does not know whether she needed to resume before seeing provider. Please call patient and advise.

## 2017-08-24 NOTE — Telephone Encounter (Signed)
Please advise 

## 2017-08-25 ENCOUNTER — Ambulatory Visit: Payer: BC Managed Care – PPO | Admitting: Internal Medicine

## 2017-08-26 NOTE — Telephone Encounter (Signed)
I contacted the patient and advised of MD's message via voicemail. Requested a call back if the patient would like to discuss further.

## 2017-08-26 NOTE — Telephone Encounter (Signed)
Patient returning missed phone call. Patient also stated she never got a phone call to discuss lab results from July. Please call patient and advise.

## 2017-10-14 ENCOUNTER — Telehealth: Payer: Self-pay | Admitting: Obstetrics and Gynecology

## 2017-10-14 NOTE — Telephone Encounter (Signed)
Patient called and stated that she would like to have Amy call her back. No other information was disclosed. Please advise.

## 2017-10-22 ENCOUNTER — Encounter: Payer: Self-pay | Admitting: Internal Medicine

## 2017-10-22 ENCOUNTER — Ambulatory Visit (INDEPENDENT_AMBULATORY_CARE_PROVIDER_SITE_OTHER): Payer: BC Managed Care – PPO | Admitting: Internal Medicine

## 2017-10-22 VITALS — BP 128/78 | HR 97 | Temp 98.1°F | Ht 65.5 in | Wt 220.4 lb

## 2017-10-22 DIAGNOSIS — E038 Other specified hypothyroidism: Secondary | ICD-10-CM | POA: Diagnosis not present

## 2017-10-22 DIAGNOSIS — Z87898 Personal history of other specified conditions: Secondary | ICD-10-CM

## 2017-10-22 DIAGNOSIS — E063 Autoimmune thyroiditis: Secondary | ICD-10-CM

## 2017-10-22 DIAGNOSIS — R7989 Other specified abnormal findings of blood chemistry: Secondary | ICD-10-CM

## 2017-10-22 LAB — T3, FREE: T3, Free: 3.7 pg/mL (ref 2.3–4.2)

## 2017-10-22 LAB — T4, FREE: Free T4: 0.66 ng/dL (ref 0.60–1.60)

## 2017-10-22 LAB — TSH: TSH: 4.66 u[IU]/mL — ABNORMAL HIGH (ref 0.35–4.50)

## 2017-10-22 NOTE — Patient Instructions (Addendum)
Please stop at the lab.  If we need to restart Levothyroxine, take the thyroid hormone every [place], with water, at least 30 minutes before breakfast, separated by at least 4 hours from: - acid reflux medications - calcium - iron - multivitamins  Please come back for a follow-up appointment in 6 months.  .Marland Kitchen

## 2017-10-22 NOTE — Progress Notes (Signed)
Patient ID: Katherine GellJamie H Barnette, female   DOB: 01-21-1976, 41 y.o.   MRN: [id number]    HPI  Katherine Callahan is a 41 y.o.-year-old female, initially referred by Yolanda BonineMelody Burr ([facility]), now returning for f/u for Hashimoto's thyroiditis. Last visit 9 mo ago.  Pt has a h/o elevated TSH x 2.  I reviewed pt's thyroid tests: Lab Results  Component Value Date   TSH 5.840 (H) 03/04/2017   TSH 3.18 01/22/2017   TSH 5.510 (H) 12/17/2016   FREET4 0.90 03/04/2017   FREET4 0.52 (L) 01/22/2017    At last visit, we dx'ed Hashimoto's thyroiditis: Component     Latest Ref Rng & Units 01/22/2017  Thyroperoxidase Ab SerPl-aCnc     <9 IU/mL 259 (H)  Thyroglobulin Ab     <2 IU/mL 1   We tried to start LT4 >> stopped when ran out of Rx. She had more energy on this.  When took LT4: - in am - fasting - eating 30 min later or more - occasional MVI, [place] - no Fe, No PPI  Pt denies: - feeling nodules in neck - hoarseness - dysphagia - choking - SOB with lying down  She has no FH of thyroid disorders. No FH of thyroid cancer. No h/o radiation tx to head or neck.  No seaweed or kelp. No recent contrast studies. No herbal supplements. No Biotin use. No recent steroids use.   Pt. also has a history of HL, migraines - uses peppermint oil.  She also has a h/o prediabetes. Last HbA1c improved: Lab Results  Component Value Date   HGBA1C 5.5 12/17/2016   HGBA1C 5.6 12/11/2015   ROS: Constitutional: no weight gain/no weight loss, + fatigue, no subjective hyperthermia, no subjective hypothermia Eyes: no blurry vision, no xerophthalmia ENT: no sore throat, + see HPI Cardiovascular: no CP/no SOB/no palpitations/no leg swelling Respiratory: no cough/no SOB/no wheezing Gastrointestinal: no N/no V/no D/no C/+ acid reflux Musculoskeletal: no muscle aches/no joint aches Skin: + rash, + hair loss Neurological: no tremors/no numbness/no tingling/no dizziness  I reviewed pt's medications, allergies,  PMH, social hx, family hx, and changes were documented in the history of present illness. Otherwise, unchanged from my initial visit note.  Past Medical History:  Diagnosis Date  . High cholesterol   . Increased risk of breast cancer   . Nasal congestion   . Pre-diabetes   . Vitamin D deficiency    Past Surgical History:  Procedure Laterality Date  . BREAST BIOPSY Left 2003   neg   Social History   Social History  . Marital status: Married    Spouse name: N/A  . Number of children: 3   Occupational History  . teacher   Social History Main Topics  . Smoking status: Never Smoker  . Smokeless tobacco: Never Used  . Alcohol use No  . Drug use: No  . Sexual activity: Yes     Comment: husband-vasectomy   Current Outpatient Prescriptions on File Prior to Visit  Medication Sig Dispense Refill  . cefdinir (OMNICEF) 300 MG capsule Take 1 capsule (300 mg total) by mouth 2 (two) times daily. (Patient not taking: Reported on 12/16/2016) 14 capsule 1  . cyanocobalamin (,VITAMIN B-12,) 1000 MCG/ML injection INJECT 1ML EVERY MONTH 1 mL 3  . ergocalciferol (VITAMIN D2) 50000 UNITS capsule Take 50,000 Units by mouth once a week. Reported on 12/11/2015    . Garlic 300 MG TABS Take 1 tablet (300 mg total) by mouth daily. (  Patient not taking: Reported on 12/16/2016) 120 each 1  . levothyroxine (SYNTHROID, LEVOTHROID) 25 MCG tablet TAKE 1 TABLET (25 MCG TOTAL) BY MOUTH DAILY BEFORE BREAKFAST. 30 tablet 2  . Omega-3 1000 MG CAPS Take 1 capsule (1,000 mg total) by mouth daily. (Patient not taking: Reported on 12/16/2016) 120 capsule 1  . phentermine (ADIPEX-P) 37.5 MG tablet Take 37.5 mg by mouth every morning.  2  . Red Yeast Rice 500 MG/0.5GM POWD Take 1 capsule by mouth daily. (Patient not taking: Reported on 12/16/2016) 120 Bottle 1  . rosuvastatin (CRESTOR) 10 MG tablet Take 1 tablet (10 mg total) by mouth daily. (Patient not taking: Reported on 01/22/2017) 30 tablet 6  . scopolamine  (TRANSDERM-SCOP, 1.5 MG,) 1 MG/3DAYS Place 1 patch (1.5 mg total) onto the skin every 3 (three) days. (Patient not taking: Reported on 12/16/2016) 5 patch 0   No current facility-administered medications on file prior to visit.    Allergies  Allergen Reactions  . Aspirin   . Codeine   . Sulfa Antibiotics   . Penicillin G Rash   Family History  Problem Relation Age of Onset  . Breast cancer Other   . Cancer Mother        breast  . Breast cancer Mother 7  . Heart disease Father     PE: BP 128/78 (BP Location: Right Arm, Patient Position: Sitting, Cuff Size: Normal)   Pulse 97   Temp 98.1 F (36.7 C) (Oral)   Ht 5' 5.5" (1.664 m)   Wt 220 lb 6.4 oz (100 kg)   LMP 10/01/2017   SpO2 98%   BMI 36.12 kg/m  Wt Readings from Last 3 Encounters:  10/22/17 220 lb 6.4 oz (100 kg)  01/22/17 214 lb (97.1 kg)  12/16/16 213 lb 4.8 oz (96.8 kg)   Constitutional: overweight, in NAD Eyes: PERRLA, EOMI, no exophthalmos ENT: moist mucous membranes, no thyromegaly, no cervical lymphadenopathy Cardiovascular: tachycardia, RR, No MRG Respiratory: CTA B Gastrointestinal: abdomen soft, NT, ND, BS+ Musculoskeletal: no deformities, strength intact in all 4 Skin: moist, warm, no rashes Neurological: no tremor with outstretched hands, DTR normal in all 4  ASSESSMENT: 1. Hashimoto's thyroiditis  2. Prediabetes  PLAN:  1. Patient with a recent history of high TSH level, initially found on annual screening labs. Retrospectively, patient admitted for weight gain and fatigue. - At last visit, we checked her thyroid antibodies and they were elevated, giving her a diagnosis of Hashimoto's thyroiditis. We did discuss at that time about criteria for starting treatment for subclinical hypothyroidism which included a TSH higher than 10, desire for pregnancy or signs and symptoms of hypothyroidism. Since she may have had some symptoms of hypothyroidism I suggested to start the low-dose levothyroxine. She  did start 25 g daily and felt better and also felt that her skin was not is dry. She stopped since last visit when she ran out of the prescription but is interesting in restarting. - we again discussed about how to take the medicine correctly if we need to restarted: Take the yroid hormone every [place] (when she took it last time she was missing some weekend doses), with water, at least 30 minutes before breakfast, separated by at least 4 hours from: - acid reflux medications - calcium - iron - multivitamins - will  check TFTs today and decide afterwards about the levothyroxine dose   2. Prediabetes - Reviewed previous HbA1c levels and they were improving  - Patient refuses to be checked  today, and tells me that this will be checked at the next visit with PCP  - she is aware about the need to lose weight (since last visit she gained 6 pounds)  - She was suggested to start on phentermine, but she does have tachycardia and was reticent to start.  Component     Latest Ref Rng & Units 10/22/2017  TSH     0.35 - 4.50 uIU/mL 4.66 (H)  T4,Free(Direct)     0.60 - 1.60 ng/dL 1.61  Triiodothyronine,Free,Serum     2.3 - 4.2 pg/mL 3.7   Will start 25 mcg LT4 and recheck labs in 1.5 mo.  Carlus Pavlov, MD PhD [facility]

## 2017-10-25 DIAGNOSIS — E038 Other specified hypothyroidism: Secondary | ICD-10-CM | POA: Insufficient documentation

## 2017-10-25 DIAGNOSIS — Z87898 Personal history of other specified conditions: Secondary | ICD-10-CM | POA: Insufficient documentation

## 2017-10-25 DIAGNOSIS — E063 Autoimmune thyroiditis: Principal | ICD-10-CM

## 2017-10-25 MED ORDER — LEVOTHYROXINE SODIUM 25 MCG PO TABS: 25.0000 ug | ORAL_TABLET | Freq: Every day | ORAL | 5 refills | Status: DC

## 2017-11-08 ENCOUNTER — Ambulatory Visit
Admission: EM | Admit: 2017-11-08 | Discharge: 2017-11-08 | Disposition: A | Discharge status: Home / Self Care | Payer: BC Managed Care – PPO | Attending: Family Medicine | Admitting: Family Medicine

## 2017-11-08 ENCOUNTER — Other Ambulatory Visit: Payer: Self-pay

## 2017-11-08 DIAGNOSIS — R059 Cough, unspecified: Secondary | ICD-10-CM

## 2017-11-08 DIAGNOSIS — R05 Cough: Secondary | ICD-10-CM | POA: Diagnosis not present

## 2017-11-08 DIAGNOSIS — J0100 Acute maxillary sinusitis, unspecified: Secondary | ICD-10-CM

## 2017-11-08 MED ORDER — AZITHROMYCIN 250 MG PO TABS: ORAL_TABLET | ORAL | 0 refills | Status: DC

## 2017-11-08 MED ORDER — BENZONATATE 200 MG PO CAPS: 200.0000 mg | ORAL_CAPSULE | Freq: Three times a day (TID) | ORAL | 0 refills | Status: DC | PRN

## 2017-11-08 NOTE — ED Triage Notes (Signed)
Patient complains of cough, congestion, sinus pain and pressure, headaches, some back pain that she is concerned could be from coughing. Patient states that symptoms started around 10 days ago.

## 2017-11-08 NOTE — ED Provider Notes (Signed)
[facility]    CSN: [id number] Arrival date & time: 11/08/17  1528     History   Chief Complaint Chief Complaint  Patient presents with  . Cough    HPI Earle GellJamie H Stradling is a 41 y.o. female.   The history is provided by the patient.  Cough  Associated symptoms: headaches   Associated symptoms: no wheezing   URI  Presenting symptoms: congestion, cough and facial pain   Severity:  Moderate Onset quality:  Sudden Duration:  10 days Timing:  Constant Progression:  Worsening Chronicity:  New Relieved by:  Nothing Ineffective treatments:  OTC medications Associated symptoms: headaches and sinus pain   Associated symptoms: no wheezing   Risk factors: not elderly, no chronic cardiac disease, no chronic kidney disease, no chronic respiratory disease, no diabetes mellitus, no immunosuppression, no recent illness and no recent travel     Past Medical History:  Diagnosis Date  . High cholesterol   . Increased risk of breast cancer   . Nasal congestion   . Pre-diabetes   . Vitamin D deficiency     Patient Active Problem List   Diagnosis Date Noted  . Hypothyroidism due to Hashimoto's thyroiditis 10/25/2017  . History of prediabetes 10/25/2017    Past Surgical History:  Procedure Laterality Date  . BREAST BIOPSY Left 2003   neg    OB History    Gravida Para Term Preterm AB Living   3         3   SAB TAB Ectopic Multiple Live Births           3       Home Medications    Prior to Admission medications   Medication Sig Start Date End Date Taking? Authorizing Provider  levothyroxine (SYNTHROID, LEVOTHROID) 25 MCG tablet Take 1 tablet (25 mcg total) daily before breakfast by mouth. 10/25/17  Yes Carlus PavlovGherghe, Cristina, MD  azithromycin (ZITHROMAX Z-PAK) 250 MG tablet 2 tabs po once Mccarthy 1, then 1 tab po qd for next 4 days 11/08/17   Payton Mccallumonty, Favor Hackler, MD  benzonatate (TESSALON) 200 MG capsule Take 1 capsule (200 mg total) 3 (three) times daily as needed by mouth.  11/08/17   Payton Mccallumonty, Chenoa Luddy, MD    Family History Family History  Problem Relation Age of Onset  . Breast cancer Other   . Cancer Mother        breast  . Breast cancer Mother 3735  . Heart disease Father     Social History Social History   Tobacco Use  . Smoking status: Never Smoker  . Smokeless tobacco: Never Used  Substance Use Topics  . Alcohol use: No  . Drug use: No     Allergies   Aspirin; Codeine; Sulfa antibiotics; and Penicillin g   Review of Systems Review of Systems  HENT: Positive for congestion and sinus pain.   Respiratory: Positive for cough. Negative for wheezing.   Neurological: Positive for headaches.     Physical Exam Triage Vital Signs ED Triage Vitals  Enc Vitals Group     BP 11/08/17 1545 (!) 142/97     Pulse Rate 11/08/17 1545 (!) 105     Resp 11/08/17 1545 18     Temp 11/08/17 1545 98.7 F (37.1 C)     Temp Source 11/08/17 1545 Oral     SpO2 11/08/17 1545 97 %     Weight 11/08/17 1543 220 lb (99.8 kg)     Height 11/08/17 1543 5' 5.5" (  1.664 m)     Head Circumference --      Peak Flow --      Pain Score 11/08/17 1543 5     Pain Loc --      Pain Edu? --      Excl. in GC? --    No data found.  Updated Vital Signs BP (!) 142/97 (BP Location: Right Arm)   Pulse (!) 105   Temp 98.7 F (37.1 C) (Oral)   Resp 18   Ht 5' 5.5" (1.664 m)   Wt 220 lb (99.8 kg)   LMP 10/24/2017   SpO2 97%   BMI 36.05 kg/m   Visual Acuity Right Eye Distance:   Left Eye Distance:   Bilateral Distance:    Right Eye Near:   Left Eye Near:    Bilateral Near:     Physical Exam  Constitutional: She appears well-developed and well-nourished. No distress.  HENT:  Head: Normocephalic and atraumatic.  Right Ear: Tympanic membrane, external ear and ear canal normal.  Left Ear: Tympanic membrane, external ear and ear canal normal.  Nose: Mucosal edema and rhinorrhea present. No nose lacerations, sinus tenderness, nasal deformity, septal deviation or  nasal septal hematoma. No epistaxis.  No foreign bodies. Right sinus exhibits maxillary sinus tenderness and frontal sinus tenderness. Left sinus exhibits maxillary sinus tenderness and frontal sinus tenderness.  Mouth/Throat: Uvula is midline, oropharynx is clear and moist and mucous membranes are normal. No oropharyngeal exudate.  Eyes: Conjunctivae and EOM are normal. Pupils are equal, round, and reactive to light. Right eye exhibits no discharge. Left eye exhibits no discharge. No scleral icterus.  Neck: Normal range of motion. Neck supple. No thyromegaly present.  Cardiovascular: Normal rate, regular rhythm and normal heart sounds.  Pulmonary/Chest: Effort normal and breath sounds normal. No respiratory distress. She has no wheezes. She has no rales.  Lymphadenopathy:    She has no cervical adenopathy.  Skin: She is not diaphoretic.  Nursing note and vitals reviewed.    UC Treatments / Results  Labs (all labs ordered are listed, but only abnormal results are displayed) Labs Reviewed - No data to display  EKG  EKG Interpretation None       Radiology No results found.  Procedures Procedures (including critical care time)  Medications Ordered in UC Medications - No data to display   Initial Impression / Assessment and Plan / UC Course  I have reviewed the triage vital signs and the nursing notes.  Pertinent labs & imaging results that were available during my care of the patient were reviewed by me and considered in my medical decision making (see chart for details).       Final Clinical Impressions(s) / UC Diagnoses   Final diagnoses:  Acute maxillary sinusitis, recurrence not specified  Cough    ED Discharge Orders        Ordered    azithromycin (ZITHROMAX Z-PAK) 250 MG tablet     11/08/17 1641    benzonatate (TESSALON) 200 MG capsule  3 times daily PRN     11/08/17 1641     1. diagnosis reviewed with patient 2. rx as per orders above; reviewed possible  side effects, interactions, risks and benefits  3. Recommend supportive treatment with otc flonase, otc analgesics prn 4. Follow-up prn if symptoms worsen or don't improve  Controlled Substance Prescriptions [place] Controlled Substance Registry consulted? Not Applicable   Payton Mccallumonty, Rohit Deloria, MD 11/08/17 [phone]

## 2017-11-17 ENCOUNTER — Encounter: Payer: Self-pay | Admitting: Obstetrics and Gynecology

## 2017-11-17 ENCOUNTER — Ambulatory Visit: Payer: BC Managed Care – PPO | Admitting: Obstetrics and Gynecology

## 2017-11-17 VITALS — BP 128/102 | HR 95 | Ht 65.0 in | Wt 219.3 lb

## 2017-11-17 DIAGNOSIS — Z23 Encounter for immunization: Secondary | ICD-10-CM | POA: Diagnosis not present

## 2017-11-17 DIAGNOSIS — R079 Chest pain, unspecified: Secondary | ICD-10-CM

## 2017-11-17 DIAGNOSIS — Z8249 Family history of ischemic heart disease and other diseases of the circulatory system: Secondary | ICD-10-CM | POA: Diagnosis not present

## 2017-11-17 DIAGNOSIS — E7801 Familial hypercholesterolemia: Secondary | ICD-10-CM | POA: Diagnosis not present

## 2017-11-17 DIAGNOSIS — R12 Heartburn: Secondary | ICD-10-CM

## 2017-11-17 MED ORDER — PANTOPRAZOLE SODIUM 20 MG PO TBEC: 20.0000 mg | DELAYED_RELEASE_TABLET | Freq: Every day | ORAL | 6 refills | Status: DC

## 2017-11-17 NOTE — Progress Notes (Signed)
Subjective:     Patient ID: Katherine Callahan, female   DOB: June 01, 1976, 41 y.o.   MRN: [id number]  HPI Here with c/o onset chest pain  4 days ago while watching a movie. Lasted for 4 hours and felt like constant pressure. Concerned due to family h/o early MI and known elevated cholesterol. Took a baby aspirin and felt better later that night. Also report pressure and shooting pain up in left front neck the following [place] with tingling sensations down left arm.  Has never been seen by cardiology.  Is having daily heartburn and TUMs helps, but it come right back. Denies nausea and vomiting.  Had stopped crestor but restarted 4 days ago.   Also needs her flu vaccine. And to establish care with a PCP. Sees endocrinology and me only at this time.   Review of Systems Negative except stated above in HPI    Objective:   Physical Exam A&ox4 Well groomed female in no distress Blood pressure (!) 128/102, pulse 95, height '5\' 5"'  (1.651 m), weight 219 lb 4.8 oz (99.5 kg), last menstrual period 11/17/2017. Body mass index is 36.49 kg/m.  HRR, lungs clear, carotid clear on auscultation Abdomen soft and not tender pelvic exam not indicated.     Assessment:     Heartburn Hyperlipidemia, with h/o early familiar MI Chest pain Needs flu shot     Plan:     Labs obtained- will follow up accordingly Echocardiogram ordered- referred to cardiology  To establish care with PCP To start protonix daily To add ASA 81 mg daily and continue crestor.   RTC as needed.  Melody [place], CNM

## 2017-11-18 ENCOUNTER — Ambulatory Visit
Admission: RE | Admit: 2017-11-18 | Discharge: 2017-11-18 | Disposition: A | Discharge status: Home / Self Care | Payer: BC Managed Care – PPO | Source: Ambulatory Visit | Attending: Obstetrics and Gynecology | Admitting: Obstetrics and Gynecology

## 2017-11-18 DIAGNOSIS — R7303 Prediabetes: Secondary | ICD-10-CM | POA: Insufficient documentation

## 2017-11-18 DIAGNOSIS — R079 Chest pain, unspecified: Secondary | ICD-10-CM | POA: Diagnosis not present

## 2017-11-18 DIAGNOSIS — E7800 Pure hypercholesterolemia, unspecified: Secondary | ICD-10-CM | POA: Insufficient documentation

## 2017-11-18 LAB — NMR, LIPOPROFILE
CHOLESTEROL: 226 mg/dL — AB (ref 100–199)
HDL Cholesterol by NMR: 49 mg/dL (ref >39)
HDL Particle Number: 45 umol/L (ref >=30.5)
LDL PARTICLE NUMBER: 2086 nmol/L — AB (ref <1000)
LDL Size: 20.3 nm (ref >20.5)
LDL-C: 135 mg/dL — AB (ref 0–99)
LP-IR Score: 75 — ABNORMAL HIGH (ref <=45)
Small LDL Particle Number: 1199 nmol/L — ABNORMAL HIGH (ref <=527)
TRIGLYCERIDES BY NMR: 209 mg/dL — AB (ref 0–149)

## 2017-11-18 LAB — COMPREHENSIVE METABOLIC PANEL
A/G RATIO: 1.7 (ref 1.2–2.2)
ALT: 20 IU/L (ref 0–32)
AST: 19 IU/L (ref 0–40)
Albumin: 4.5 g/dL (ref 3.5–5.5)
Alkaline Phosphatase: 61 IU/L (ref 39–117)
BILIRUBIN TOTAL: 0.6 mg/dL (ref 0.0–1.2)
BUN/Creatinine Ratio: 11 (ref 9–23)
BUN: 9 mg/dL (ref 6–24)
CALCIUM: 9.4 mg/dL (ref 8.7–10.2)
CHLORIDE: 100 mmol/L (ref 96–106)
CO2: 23 mmol/L (ref 20–29)
Creatinine, Ser: 0.79 mg/dL (ref 0.57–1.00)
GFR calc Af Amer: 106 mL/min/{1.73_m2} (ref >59)
GFR, EST NON AFRICAN AMERICAN: 92 mL/min/{1.73_m2} (ref >59)
Globulin, Total: 2.6 g/dL (ref 1.5–4.5)
Glucose: 83 mg/dL (ref 65–99)
POTASSIUM: 4.2 mmol/L (ref 3.5–5.2)
Sodium: 138 mmol/L (ref 134–144)
Total Protein: 7.1 g/dL (ref 6.0–8.5)

## 2017-11-18 LAB — THYROID PANEL WITH TSH
Free Thyroxine Index: 1.4 (ref 1.2–4.9)
T3 UPTAKE RATIO: 20 % — AB (ref 24–39)
T4 TOTAL: 6.9 ug/dL (ref 4.5–12.0)
TSH: 6.17 u[IU]/mL — AB (ref 0.450–4.500)

## 2017-11-18 LAB — D-DIMER, QUANTITATIVE: D-DIMER: 0.85 mg/L FEU — ABNORMAL HIGH (ref 0.00–0.49)

## 2017-11-18 NOTE — Progress Notes (Signed)
*  PRELIMINARY RESULTS* Echocardiogram 2D Echocardiogram has been performed.  Cristela BlueHege, Shaquilla Kehres 11/18/2017, 12:00 PM

## 2017-11-20 NOTE — Progress Notes (Signed)
Cardiology Office Note  Date:  11/22/2017   ID:  Katherine Callahan, DOB 08-Jul-1976, MRN [id number]  PCP: Harlow MaresMelody Shambley  Chief Complaint  Patient presents with  . other    Ref by Harlow MaresMelody Shambley for a chest pain & a family Hx. of premature CAD. Pt. c/o chest pain that radiated down left arm.     HPI:  Katherine Callahan is a 41 year old woman with past medical history of Hyperlipidemia GERD Hypothyroidism Strong family history of coronary disease [facility] of her chest pain symptoms  She reports having episode of chest pain approximately 2 weeks ago while watching a movie.  Lasted for 4 hours, constant pressure. Took a baby aspirin and felt better later that night.  Restarted her Crestor Also episode of pain in her left neck Separate episode of discomfort down the left arm, tingling  family h/o early MI , his father was heavy smoker, high cholesterol  She does have heartburn symptoms  Long discussion concerning her cholesterol numbers, as back discomfort and various myalgias on Crestor daily.  Takes medication intermittently    Echo 11/18/2017 reviewed with her in detail Normal with gd 1 diastolic dysfunction  Labs 11/17/2017 reviewed with her in detail Total chol 226 (was 253) , LDL 135 (was 153) TSH 6.170  Family history Father with MI, age 43,  EKG personally reviewed by myself on todays visit Shows normal sinus rhythm rate 93 bpm left axis deviation no significant ST or T wave changes  PMH:   has a past medical history of High cholesterol, Increased risk of breast cancer, Nasal congestion, Pre-diabetes, and Vitamin D deficiency.  PSH:    Past Surgical History:  Procedure Laterality Date  . BREAST BIOPSY Left 2003   neg    Current Outpatient Medications  Medication Sig Dispense Refill  . levothyroxine (SYNTHROID, LEVOTHROID) 25 MCG tablet Take 1 tablet (25 mcg total) daily before breakfast by mouth. 45 tablet 5  .  pantoprazole (PROTONIX) 20 MG tablet Take 1 tablet (20 mg total) by mouth daily. 30 tablet 6  . rosuvastatin (CRESTOR) 10 MG tablet Take 10 mg by mouth daily.     No current facility-administered medications for this visit.      Allergies:   Aspirin; Codeine; Sulfa antibiotics; and Penicillin g   Social History:  The patient  reports that  has never smoked. she has never used smokeless tobacco. She reports that she does not drink alcohol or use drugs.   Family History:   family history includes Breast cancer in her other; Breast cancer (age of onset: 8135) in her mother; Cancer in her mother; Heart attack in her father; Heart disease (age of onset: 950) in her father.    Review of Systems: Review of Systems  Constitutional: Negative.   Respiratory: Negative.   Cardiovascular: Positive for chest pain.  Gastrointestinal: Negative.   Musculoskeletal: Negative.   Neurological: Negative.   Psychiatric/Behavioral: Negative.   All other systems reviewed and are negative.    PHYSICAL EXAM: VS:  BP 120/88 (BP Location: Right Arm, Patient Position: Sitting, Cuff Size: Normal)   Pulse 93   Ht 5\' 5"  (1.651 m)   Wt 219 lb 4 oz (99.5 kg)   LMP 11/17/2017   BMI 36.49 kg/m  , BMI Body mass index is 36.49 kg/m. GEN: Well nourished, well developed, in no acute distress  HEENT: normal  Neck: no JVD, carotid bruits, or masses Cardiac: RRR; no murmurs, rubs,  or gallops,no edema  Respiratory:  clear to auscultation bilaterally, normal work of breathing GI: soft, nontender, nondistended, + BS MS: no deformity or atrophy  Skin: warm and dry, no rash Neuro:  Strength and sensation are intact Psych: euthymic mood, full affect    Recent Labs: 12/17/2016: Hemoglobin 14.8; Platelets 259 11/17/2017: ALT 20; BUN 9; Creatinine, Ser 0.79; Potassium 4.2; Sodium 138; TSH 6.170    Lipid Panel Lab Results  Component Value Date   CHOL 226 (H) 11/17/2017   HDL 49 11/17/2017   LDLCALC 153 (H)  12/11/2015   TRIG 209 (H) 11/17/2017      Wt Readings from Last 3 Encounters:  11/22/17 219 lb 4 oz (99.5 kg)  11/17/17 219 lb 4.8 oz (99.5 kg)  11/08/17 220 lb (99.8 kg)       ASSESSMENT AND PLAN:  Chest pain, unspecified type - Plan: EKG 12-Lead, CT CARDIAC SCORING Atypical in nature Normal echocardiogram Risk factor includes hyperlipidemia, There is some family history but father heavy smoker, difficult to make comparison No exertional chest pain.  Discussed various strategies for risk stratification including stress testing, CT coronary calcium scoring.  She currently feels well without any complaints Recommended she think about CT coronary calcium scoring particularly if she has any recurrent symptoms.   Mixed hyperlipidemia Long discussion concerning her cholesterol numbers She will retry the Crestor Long discussion concerning dietary changes, need to increase her exercise for weight loss  History of prediabetes We have encouraged continued exercise, careful diet management in an effort to lose weight.   Disposition:   F/U as needed  Patient [facility] and will be referred back to her office for ongoing care of the issues detailed above   Total encounter time more than 60 minutes  Greater than 50% was spent in counseling and coordination of care with the patient   Orders Placed This Encounter  Procedures  . CT CARDIAC SCORING  . EKG 12-Lead     Signed, Dossie Arbourim Foy Vanduyne, M.D., Ph.D. 11/22/2017  [facility] [place] [phone]

## 2017-11-22 ENCOUNTER — Encounter: Payer: Self-pay | Admitting: Cardiovascular Disease

## 2017-11-22 ENCOUNTER — Ambulatory Visit (INDEPENDENT_AMBULATORY_CARE_PROVIDER_SITE_OTHER): Payer: BC Managed Care – PPO | Admitting: Cardiovascular Disease

## 2017-11-22 VITALS — BP 120/88 | HR 93 | Ht 65.0 in | Wt 219.2 lb

## 2017-11-22 DIAGNOSIS — R079 Chest pain, unspecified: Secondary | ICD-10-CM

## 2017-11-22 DIAGNOSIS — Z87898 Personal history of other specified conditions: Secondary | ICD-10-CM | POA: Diagnosis not present

## 2017-11-22 DIAGNOSIS — E782 Mixed hyperlipidemia: Secondary | ICD-10-CM

## 2017-11-22 DIAGNOSIS — E785 Hyperlipidemia, unspecified: Secondary | ICD-10-CM | POA: Insufficient documentation

## 2017-11-22 NOTE — Patient Instructions (Signed)
Medication Instructions:   No medication changes made  Labwork:  No new labs needed  Testing/Procedures:  We will order a CT coronary calcium score for family hx Phone #   Follow-Up: It was a pleasure seeing you in the office today. Please call us if you have new issues that need to be addressed before your next appt.  [phone]  Your physician wants you to follow-up in:  As needed  If you need a refill on your cardiac medications before your next appointment, please call your pharmacy.

## 2017-12-17 ENCOUNTER — Encounter: Payer: BC Managed Care – PPO | Admitting: Obstetrics and Gynecology

## 2017-12-22 ENCOUNTER — Encounter: Payer: BC Managed Care – PPO | Admitting: Obstetrics and Gynecology

## 2018-01-11 ENCOUNTER — Other Ambulatory Visit: Payer: Self-pay | Admitting: *Deleted

## 2018-01-11 MED ORDER — ROSUVASTATIN CALCIUM 10 MG PO TABS: 10.0000 mg | ORAL_TABLET | Freq: Every day | ORAL | 3 refills | Status: DC

## 2018-04-21 ENCOUNTER — Ambulatory Visit: Payer: BC Managed Care – PPO | Admitting: Internal Medicine

## 2019-01-23 ENCOUNTER — Other Ambulatory Visit: Payer: Self-pay

## 2019-01-23 ENCOUNTER — Ambulatory Visit
Admission: EM | Admit: 2019-01-23 | Discharge: 2019-01-23 | Disposition: A | Discharge status: Home / Self Care | Payer: BC Managed Care – PPO | Attending: Family Medicine | Admitting: Family Medicine

## 2019-01-23 ENCOUNTER — Encounter: Payer: Self-pay | Admitting: Emergency Medicine

## 2019-01-23 DIAGNOSIS — R509 Fever, unspecified: Secondary | ICD-10-CM | POA: Diagnosis not present

## 2019-01-23 DIAGNOSIS — R05 Cough: Secondary | ICD-10-CM | POA: Diagnosis not present

## 2019-01-23 DIAGNOSIS — J111 Influenza due to unidentified influenza virus with other respiratory manifestations: Secondary | ICD-10-CM

## 2019-01-23 DIAGNOSIS — R69 Illness, unspecified: Principal | ICD-10-CM

## 2019-01-23 MED ORDER — DOXYCYCLINE HYCLATE 100 MG PO CAPS: 100.0000 mg | ORAL_CAPSULE | Freq: Two times a day (BID) | ORAL | 0 refills | Status: DC

## 2019-01-23 MED ORDER — BENZONATATE 100 MG PO CAPS: 100.0000 mg | ORAL_CAPSULE | Freq: Three times a day (TID) | ORAL | 0 refills | Status: DC | PRN

## 2019-01-23 NOTE — ED Triage Notes (Signed)
Patient c/o cough and fever that started Thursday. Patient is taking Mucinex OTC for her symptoms.

## 2019-01-23 NOTE — ED Provider Notes (Signed)
[facility] ____________________________________________  Time seen: Approximately 10:08 AM  I have reviewed the triage vital signs and the nursing notes.   HISTORY  Chief Complaint Cough and Fever   HPI Katherine Callahan is a 43 y.o. female presenting for evaluation of 4 days of low-grade fevers around 99-100 as well as generalized body aches and cough.  States some nasal congestion and sinus pressure.  States cough is predominantly a dry hacking cough, occasionally productive.  Has been taken over-the-counter Tylenol and Mucinex.  States her husband had similar prior to her onset and her son has similar now.  Continues to overall eat and drink well.  Not much of a sore throat.  Denies recent sickness.  Denies chest pain, shortness of breath, abdominal pain.  Reports otherwise doing well denies other complaints.  Patient's last menstrual period was 01/16/2019.   Past Medical History:  Diagnosis Date  . High cholesterol   . Increased risk of breast cancer   . Nasal congestion   . Pre-diabetes   . Vitamin D deficiency     Patient Active Problem List   Diagnosis Date Noted  . Hyperlipidemia 11/22/2017  . Chest pain 11/22/2017  . Hypothyroidism due to Hashimoto's thyroiditis 10/25/2017  . History of prediabetes 10/25/2017    Past Surgical History:  Procedure Laterality Date  . BREAST BIOPSY Left 2003   neg     No current facility-administered medications for this encounter.   Current Outpatient Medications:  .  benzonatate (TESSALON PERLES) 100 MG capsule, Take 1 capsule (100 mg total) by mouth 3 (three) times daily as needed., Disp: 15 capsule, Rfl: 0 .  [START ON 01/25/2019] doxycycline (VIBRAMYCIN) 100 MG capsule, Take 1 capsule (100 mg total) by mouth 2 (two) times daily., Disp: 20 capsule, Rfl: 0  Allergies Aspirin; Codeine; Sulfa antibiotics; and Penicillin g  Family History  Problem Relation Age of Onset  . Breast cancer Other   . Cancer Mother    breast  . Breast cancer Mother 65  . Heart disease Father 20       MI  . Heart attack Father     Social History Social History   Tobacco Use  . Smoking status: Never Smoker  . Smokeless tobacco: Never Used  Substance Use Topics  . Alcohol use: No  . Drug use: No    Review of Systems Constitutional: Positive fevers ENT: As above Cardiovascular: Denies chest pain. Respiratory: Denies shortness of breath. Gastrointestinal: No abdominal pain.  Musculoskeletal: Negative for back pain. Skin: Negative for rash.   ____________________________________________   PHYSICAL EXAM:  VITAL SIGNS: ED Triage Vitals  Enc Vitals Group     BP 01/23/19 0828 102/90     Pulse Rate 01/23/19 0828 89     Resp 01/23/19 0828 18     Temp 01/23/19 0828 97.9 F (36.6 C)     Temp Source 01/23/19 0828 Oral     SpO2 01/23/19 0828 98 %     Weight 01/23/19 0825 210 lb (95.3 kg)     Height 01/23/19 0825 5\' 6"  (1.676 m)     Head Circumference --      Peak Flow --      Pain Score 01/23/19 0825 0     Pain Loc --      Pain Edu? --      Excl. in GC? --     Constitutional: Alert and oriented. Well appearing and in no acute distress. Eyes: Conjunctivae are normal.  Head:  Atraumatic.Mild tenderness to palpation bilateral maxillary sinuses.  No frontal sinus tenderness palpation.  No swelling. No erythema.   Ears: no erythema, normal TMs bilaterally.   Nose: nasal congestion   Mouth/Throat: Mucous membranes are moist. Oropharynx non-erythematous.No tonsillar swelling or exudate.  Neck: No stridor.  No cervical spine tenderness to palpation. Hematological/Lymphatic/Immunilogical: No cervical lymphadenopathy. Cardiovascular: Normal rate, regular rhythm. Grossly normal heart sounds.  Good peripheral circulation. Respiratory: Normal respiratory effort.  No retractions. No wheezes, rales or rhonchi. Good air movement.  Gastrointestinal: Soft and nontender.  Musculoskeletal steady gait.  No lower  extremity edema noted bilaterally. Neurologic:  Normal speech and language. No gait instability. Skin:  Skin is warm, dry and intact. No rash noted. Psychiatric: Mood and affect are normal. Speech and behavior are normal.  ___________________________________________   LABS (all labs ordered are listed, but only abnormal results are displayed)  Labs Reviewed - No data to display ____________________________________________  PROCEDURES Procedures   INITIAL IMPRESSION / ASSESSMENT AND PLAN / ED COURSE  Pertinent labs & imaging results that were available during my care of the patient were reviewed by me and considered in my medical decision making (see chart for details).   Well-appearing patient. No acute distress. Suspect viral illness such as influenza.  Patient with some sinus pressure, however again discussed predominantly viral illness.  Will treat supportively with PRN Tessalon Perles, patient declined nighttime cough medication.  Rx hardcopy postdated for doxycycline to begin in 3 to 4 days if symptoms persist.  Discussed sooner reevaluation for any worsening complaints.  Encourage rest, fluids, supportive care, over-the-counter Tylenol and Profen as needed.  Work note given.Discussed indication, risks and benefits of medications with patient.  Discussed follow up with Primary care physician this week. Discussed follow up and return parameters including no resolution or any worsening concerns. Patient verbalized understanding and agreed to plan.   ____________________________________________   FINAL CLINICAL IMPRESSION(S) / ED DIAGNOSES  Final diagnoses:  Influenza-like illness     ED Discharge Orders         Ordered    doxycycline (VIBRAMYCIN) 100 MG capsule  2 times daily     01/23/19 0857    benzonatate (TESSALON PERLES) 100 MG capsule  3 times daily PRN     01/23/19 0856           Note: This dictation was prepared with Dragon dictation along with smaller phrase  technology. Any transcriptional errors that result from this process are unintentional.         Renford Dills, NP 01/23/19 1015

## 2019-01-23 NOTE — Discharge Instructions (Signed)
Take medication as prescribed. Rest. Drink plenty of fluids.  Continue over-the-counter supportive medicine.  Follow up with your primary care physician this week as needed. Return to Urgent care for new or worsening concerns.

## 2019-08-18 ENCOUNTER — Encounter: Payer: BC Managed Care – PPO | Admitting: Obstetrics and Gynecology

## 2019-09-07 ENCOUNTER — Ambulatory Visit (INDEPENDENT_AMBULATORY_CARE_PROVIDER_SITE_OTHER): Payer: BC Managed Care – PPO | Admitting: Obstetrics and Gynecology

## 2019-09-07 ENCOUNTER — Other Ambulatory Visit (HOSPITAL_COMMUNITY)
Admission: RE | Admit: 2019-09-07 | Discharge: 2019-09-07 | Disposition: A | Discharge status: Home / Self Care | Payer: BC Managed Care – PPO | Source: Ambulatory Visit | Attending: Obstetrics and Gynecology | Admitting: Obstetrics and Gynecology

## 2019-09-07 ENCOUNTER — Other Ambulatory Visit: Payer: Self-pay

## 2019-09-07 ENCOUNTER — Encounter: Payer: Self-pay | Admitting: Obstetrics and Gynecology

## 2019-09-07 VITALS — BP 118/79 | HR 83 | Ht 65.0 in | Wt 219.1 lb

## 2019-09-07 DIAGNOSIS — E038 Other specified hypothyroidism: Secondary | ICD-10-CM | POA: Diagnosis not present

## 2019-09-07 DIAGNOSIS — E7801 Familial hypercholesterolemia: Secondary | ICD-10-CM | POA: Diagnosis not present

## 2019-09-07 DIAGNOSIS — Z6836 Body mass index (BMI) 36.0-36.9, adult: Secondary | ICD-10-CM

## 2019-09-07 DIAGNOSIS — F3281 Premenstrual dysphoric disorder: Secondary | ICD-10-CM

## 2019-09-07 DIAGNOSIS — Z01419 Encounter for gynecological examination (general) (routine) without abnormal findings: Secondary | ICD-10-CM | POA: Insufficient documentation

## 2019-09-07 DIAGNOSIS — E063 Autoimmune thyroiditis: Secondary | ICD-10-CM

## 2019-09-07 DIAGNOSIS — Z23 Encounter for immunization: Secondary | ICD-10-CM | POA: Diagnosis not present

## 2019-09-07 MED ORDER — FLUOXETINE HCL 10 MG PO CAPS: 10.0000 mg | ORAL_CAPSULE | Freq: Every day | ORAL | 3 refills | Status: DC

## 2019-09-07 NOTE — Progress Notes (Signed)
Subjective:   Katherine Callahan is a 43 y.o. G32P0 Caucasian female here for a routine well-woman exam.  Patient's last menstrual period was 08/19/2019.    Current complaints: worsening PMS over last year with aggitation.  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  Past Medical History Past Medical History:  Diagnosis Date  . High cholesterol   . Increased risk of breast cancer   . Nasal congestion   . Pre-diabetes   . Vitamin D deficiency     Past Surgical History Past Surgical History:  Procedure Laterality Date  . BREAST BIOPSY Left 2003   neg    Gynecologic History G3P0  Patient's last menstrual period was 08/19/2019. Contraception: tubal ligation Last Pap: 2015. Results were: normal Last mammogram: 06/2017. Results were: normal   Obstetric History OB History  Gravida Para Term Preterm AB Living  3         3  SAB TAB Ectopic Multiple Live Births          3    # Outcome Date GA Lbr Len/2nd Weight Sex Delivery Anes PTL Lv  3 Gravida 2012    F Vag-Spont   LIV  2 Gravida 2007    M Vag-Spont   LIV  1 Gravida 2001    M Vag-Spont   LIV    Current Medications No current outpatient medications on file prior to visit.   No current facility-administered medications on file prior to visit.     Review of Systems Patient denies any headaches, blurred vision, shortness of breath, chest pain, abdominal pain, problems with bowel movements, urination, or intercourse.  Objective:  BP 118/79   Pulse 83   Ht 5\' 5"  (1.651 m)   Wt 219 lb 1.6 oz (99.4  kg)   LMP 08/19/2019   BMI 36.46 kg/m  Physical Exam  General:  Well developed, well nourished, no acute distress. She is alert and oriented x3. Skin:  Warm and dry Neck:  Midline trachea, no thyromegaly or nodules Cardiovascular: Regular rate and rhythm, no murmur heard Lungs:  Effort normal, all lung fields clear to auscultation bilaterally Breasts:  No dominant palpable mass, retraction, or nipple discharge Abdomen:  Soft, non tender, no hepatosplenomegaly or masses Pelvic:  External genitalia is normal in appearance.  The vagina is normal in appearance. The cervix is bulbous, no CMT.  Thin prep pap is not done  Uterus is felt to be normal size, shape, and contour.  No adnexal masses or tenderness noted. Extremities:  No swelling or varicosities noted Psych:  She has a normal mood and affect  Assessment:   Healthy well-woman exam BMI 36 Thyroid disorder PMDD Needs flu vaccine  Plan:  Labs obtained- will follow up accordingly Discuss treatment options for PMS and is willing to try prozac, instructed on use and expected outcome.  Will seek [facility] at [facility] for PCP Flu vaccine given F/U 1 year for AE, or sooner if needed Mammogram ordered  Engelbert Sevin Rockney Ghee, CNM

## 2019-09-07 NOTE — Patient Instructions (Addendum)
 Preventive Care 21-45 Years Old, Female Preventive care refers to visits with your health care provider and lifestyle choices that can promote health and wellness. This includes:  A yearly physical exam. This may also be called an annual well check.  Regular dental visits and eye exams.  Immunizations.  Screening for certain conditions.  Healthy lifestyle choices, such as eating a healthy diet, getting regular exercise, not using drugs or products that contain nicotine and tobacco, and limiting alcohol use. What can I expect for my preventive care visit? Physical exam Your health care provider will check your:  Height and weight. This may be used to calculate body mass index (BMI), which tells if you are at a healthy weight.  Heart rate and blood pressure.  Skin for abnormal spots. Counseling Your health care provider may ask you questions about your:  Alcohol, tobacco, and drug use.  Emotional well-being.  Home and relationship well-being.  Sexual activity.  Eating habits.  Work and work environment.  Method of birth control.  Menstrual cycle.  Pregnancy history. What immunizations do I need?  Influenza (flu) vaccine  This is recommended every year. Tetanus, diphtheria, and pertussis (Tdap) vaccine  You may need a Td booster every 10 years. Varicella (chickenpox) vaccine  You may need this if you have not been vaccinated. Human papillomavirus (HPV) vaccine  If recommended by your health care provider, you may need three doses over 6 months. Measles, mumps, and rubella (MMR) vaccine  You may need at least one dose of MMR. You may also need a second dose. Meningococcal conjugate (MenACWY) vaccine  One dose is recommended if you are age 19-21 years and a first-year college student living in a residence hall, or if you have one of several medical conditions. You may also need additional booster doses. Pneumococcal conjugate (PCV13) vaccine  You may need  this if you have certain conditions and were not previously vaccinated. Pneumococcal polysaccharide (PPSV23) vaccine  You may need one or two doses if you smoke cigarettes or if you have certain conditions. Hepatitis A vaccine  You may need this if you have certain conditions or if you travel or work in places where you may be exposed to hepatitis A. Hepatitis B vaccine  You may need this if you have certain conditions or if you travel or work in places where you may be exposed to hepatitis B. Haemophilus influenzae type b (Hib) vaccine  You may need this if you have certain conditions. You may receive vaccines as individual doses or as more than one vaccine together in one shot (combination vaccines). Talk with your health care provider about the risks and benefits of combination vaccines. What tests do I need?  Blood tests  Lipid and cholesterol levels. These may be checked every 5 years starting at age 20.  Hepatitis C test.  Hepatitis B test. Screening  Diabetes screening. This is done by checking your blood sugar (glucose) after you have not eaten for a while (fasting).  Sexually transmitted disease (STD) testing.  BRCA-related cancer screening. This may be done if you have a family history of breast, ovarian, tubal, or peritoneal cancers.  Pelvic exam and Pap test. This may be done every 3 years starting at age 21. Starting at age 30, this may be done every 5 years if you have a Pap test in combination with an HPV test. Talk with your health care provider about your test results, treatment options, and if necessary, the need for more   tests. Follow these instructions at home: Eating and drinking   Eat a diet that includes fresh fruits and vegetables, whole grains, lean protein, and low-fat dairy.  Take vitamin and mineral supplements as recommended by your health care provider.  Do not drink alcohol if: ? Your health care provider tells you not to drink. ? You are  pregnant, may be pregnant, or are planning to become pregnant.  If you drink alcohol: ? Limit how much you have to 0-1 drink a [place]. ? Be aware of how much alcohol is in your drink. In the [place] one drink equals one 12 oz bottle of beer (355 mL), one 5 oz glass of wine (148 mL), or one 1 oz glass of hard liquor (44 mL). Lifestyle  Take daily care of your teeth and gums.  Stay active. Exercise for at least 30 minutes on 5 or more days each week.  Do not use any products that contain nicotine or tobacco, such as cigarettes, e-cigarettes, and chewing tobacco. If you need help quitting, ask your health care provider.  If you are sexually active, practice safe sex. Use a condom or other form of birth control (contraception) in order to prevent pregnancy and STIs (sexually transmitted infections). If you plan to become pregnant, see your health care provider for a preconception visit. What's next?  Visit your health care provider once a year for a well check visit.  Ask your health care provider how often you should have your eyes and teeth checked.  Stay up to date on all vaccines. This information is not intended to replace advice given to you by your health care provider. Make sure you discuss any questions you have with your health care provider. Document Released: 02/02/2002 Document Revised: 08/18/2018 Document Reviewed: 08/18/2018 Elsevier Patient Education  [place].  Premenstrual Syndrome Premenstrual syndrome (PMS) is a group of physical, emotional, and behavioral symptoms that affect women of childbearing age as part of their menstrual cycle. PMS starts 1-2 weeks before the start of a woman's menstrual period and goes away a few days after menstrual bleeding starts. It often happens in a predictable pattern (recurs). PMS may cause other health conditions to become worse, such as asthma, allergies, and migraines. PMS can range from mild to severe. When it is severe, it is  called premenstrual dysphoric disorder (PMDD). PMS may interfere with normal daily activities. What are the causes? The cause of this condition is not known, but it seems to be related to hormone changes that happen before menstruation. What are the signs or symptoms? Symptoms of this condition often happen every month. They go away completely after your period starts. Physical symptoms of this condition include:  Bloating.  Breast pain.  Headaches.  Extreme fatigue.  Backaches.  Swelling of the hands and feet.  Weight gain.  Hot flashes. Emotional and behavioral symptoms of this condition include:  Mood swings.  Depression.  Angry outbursts.  Irritability.  Anxiety.  Crying spells.  Food cravings or appetite changes.  Changes in sexual desire.  Confusion.  Aggression.  Social withdrawal.  Poor concentration. How is this diagnosed? This condition may be diagnosed based on a history of your symptoms. This condition is generally diagnosed if symptoms of PMS:  Are present in the 5 days before your period starts.  End within 4 days after your period starts.  Happen at least 3 months in a row.  Interfere with some of your normal activities. Other conditions that can cause some  of these symptoms must be ruled out before PMS can be diagnosed. These include depression, anxiety, anemia, and thyroid problems. How is this treated? This condition may be treated by:  Maintaining a healthy lifestyle. This includes eating a well-balanced diet and exercising regularly.  Taking medicines. Medicines can help relieve symptoms such as cramps, aches, pains, headaches, and breast tenderness. Depending on the severity of the condition, your health care provider may recommend various over-the-counter pain medicines. Follow these instructions at home: Eating and drinking   Eat a well-balanced diet.  Avoid caffeine and alcohol.  Limit the amount of salt and salty foods  you eat. This will help reduce bloating.  Drink enough fluid to keep your urine pale yellow.  Take a multivitamin if told to do so by your health care provider. Lifestyle   Do not use any products that contain nicotine or tobacco, such as cigarettes, e-cigarettes, and chewing tobacco. If you need help quitting, ask your health care provider.  Exercise regularly as suggested by your health care provider.  Get enough sleep. For most adults, this is 7-8 hours of sleep each night.  Practice relaxation techniques such as yoga, tai chi, or meditation.  Find healthy ways to manage stress. General instructions   For 2-3 months, write down your symptoms, their severity, and how long they last. This will help your health care provider choose the best treatment for you.  Take over-the-counter and prescription medicines only as told by your health care provider.  If you are using birth control pills (oral contraceptives), use them as told by your health care provider. Contact a health care provider if:  Your symptoms get worse.  You develop new symptoms.  You have trouble doing your daily activities. Summary  Premenstrual syndrome (PMS) is a group of physical, emotional, and behavioral symptoms that affect women of childbearing age.  PMS starts 1-2 weeks before the start of a woman's period and goes away a few days after the period starts.  PMS is treated by maintaining a healthy lifestyle and taking medicines to relieve the symptoms. This information is not intended to replace advice given to you by your health care provider. Make sure you discuss any questions you have with your health care provider. Document Released: 12/04/2000 Document Revised: 07/20/2018 Document Reviewed: 07/20/2018 Elsevier Patient Education  2020 [employer].

## 2019-09-08 LAB — HEMOGLOBIN A1C
Est. average glucose Bld gHb Est-mCnc: 114 mg/dL
Hgb A1c MFr Bld: 5.6 % (ref 4.8–5.6)

## 2019-09-08 LAB — COMPREHENSIVE METABOLIC PANEL
ALT: 26 IU/L (ref 0–32)
AST: 28 IU/L (ref 0–40)
Albumin/Globulin Ratio: 1.7 (ref 1.2–2.2)
Albumin: 4.6 g/dL (ref 3.8–4.8)
Alkaline Phosphatase: 60 IU/L (ref 39–117)
BUN/Creatinine Ratio: 16 (ref 9–23)
BUN: 12 mg/dL (ref 6–24)
Bilirubin Total: 0.5 mg/dL (ref 0.0–1.2)
CO2: 21 mmol/L (ref 20–29)
Calcium: 9.6 mg/dL (ref 8.7–10.2)
Chloride: 100 mmol/L (ref 96–106)
Creatinine, Ser: 0.75 mg/dL (ref 0.57–1.00)
GFR calc Af Amer: 112 mL/min/{1.73_m2} (ref >59)
GFR calc non Af Amer: 97 mL/min/{1.73_m2} (ref >59)
Globulin, Total: 2.7 g/dL (ref 1.5–4.5)
Glucose: 93 mg/dL (ref 65–99)
Potassium: 4.5 mmol/L (ref 3.5–5.2)
Sodium: 138 mmol/L (ref 134–144)
Total Protein: 7.3 g/dL (ref 6.0–8.5)

## 2019-09-08 LAB — NMR, LIPOPROFILE
Cholesterol, Total: 211 mg/dL — ABNORMAL HIGH (ref 100–199)
HDL Particle Number: 41 umol/L (ref >=30.5)
HDL-C: 48 mg/dL (ref >39)
LDL Particle Number: 1831 nmol/L — ABNORMAL HIGH (ref <1000)
LDL Size: 20 nm — ABNORMAL LOW (ref >20.5)
LDL-C (NIH Calc): 121 mg/dL — ABNORMAL HIGH (ref 0–99)
LP-IR Score: 83 — ABNORMAL HIGH (ref <=45)
Small LDL Particle Number: 1311 nmol/L — ABNORMAL HIGH (ref <=527)
Triglycerides: 240 mg/dL — ABNORMAL HIGH (ref 0–149)

## 2019-09-08 LAB — CBC
Hematocrit: 46.7 % — ABNORMAL HIGH (ref 34.0–46.6)
Hemoglobin: 15.4 g/dL (ref 11.1–15.9)
MCH: 29.6 pg (ref 26.6–33.0)
MCHC: 33 g/dL (ref 31.5–35.7)
MCV: 90 fL (ref 79–97)
Platelets: 268 10*3/uL (ref 150–450)
RBC: 5.2 x10E6/uL (ref 3.77–5.28)
RDW: 13.2 % (ref 11.7–15.4)
WBC: 7.6 10*3/uL (ref 3.4–10.8)

## 2019-09-08 LAB — VITAMIN D 25 HYDROXY (VIT D DEFICIENCY, FRACTURES): Vit D, 25-Hydroxy: 31.8 ng/mL (ref 30.0–100.0)

## 2019-09-08 LAB — THYROID PANEL WITH TSH
Free Thyroxine Index: 1.2 (ref 1.2–4.9)
T3 Uptake Ratio: 18 % — ABNORMAL LOW (ref 24–39)
T4, Total: 6.8 ug/dL (ref 4.5–12.0)
TSH: 8.36 u[IU]/mL — ABNORMAL HIGH (ref 0.450–4.500)

## 2019-09-14 LAB — CYTOLOGY - PAP
Diagnosis: NEGATIVE
High risk HPV: NEGATIVE
Molecular Disclaimer: 56
Molecular Disclaimer: DETECTED
Molecular Disclaimer: NORMAL

## 2019-09-22 ENCOUNTER — Other Ambulatory Visit: Payer: Self-pay | Admitting: *Deleted

## 2019-09-22 DIAGNOSIS — Z1231 Encounter for screening mammogram for malignant neoplasm of breast: Secondary | ICD-10-CM

## 2019-09-22 MED ORDER — FLUOXETINE HCL 10 MG PO CAPS: 10.0000 mg | ORAL_CAPSULE | Freq: Every day | ORAL | 3 refills | Status: DC

## 2019-10-20 ENCOUNTER — Telehealth: Payer: Self-pay | Admitting: Obstetrics and Gynecology

## 2019-10-20 NOTE — Telephone Encounter (Signed)
The patient called and stated that she recently got a bill for bloodwork and it was coded incorrectly. Pt stated that she had the bloodwork done bc she has a vitamin D deficiency. Pt was informed by insurance that if it is re-coded it will be covered under her insurance. Please advise.

## 2019-10-20 NOTE — Telephone Encounter (Signed)
Pt aware vit d def code has been added to lab.

## 2019-11-09 ENCOUNTER — Encounter: Payer: BC Managed Care – PPO | Admitting: Obstetrics and Gynecology

## 2020-02-22 ENCOUNTER — Ambulatory Visit: Payer: BC Managed Care – PPO | Attending: Internal Medicine

## 2020-02-22 ENCOUNTER — Other Ambulatory Visit: Payer: Self-pay

## 2020-02-22 DIAGNOSIS — Z23 Encounter for immunization: Secondary | ICD-10-CM | POA: Insufficient documentation

## 2020-02-22 NOTE — Progress Notes (Signed)
   Covid-19 Vaccination Clinic  Name:  Breona Cherubin Barre    MRN: [id number] DOB: 24-Jan-1976  02/22/2020  Ms. Checo was observed post Covid-19 immunization for 15 minutes without incident. She was provided with Vaccine Information Sheet and instruction to access the V-Safe system.   Ms. Axton was instructed to call 911 with any severe reactions post vaccine: Marland Kitchen Difficulty breathing  . Swelling of face and throat  . A fast heartbeat  . A bad rash all over body  . Dizziness and weakness   Immunizations Administered    Name Date Dose VIS Date Route   Pfizer COVID-19 Vaccine 02/22/2020  8:47 AM 0.3 mL 12/01/2019 Intramuscular   Manufacturer: [employer], [employer]   Lot: [id number]   NDC: [address]-4

## 2020-02-29 ENCOUNTER — Encounter: Payer: Self-pay | Admitting: Certified Nurse Midwife

## 2020-02-29 ENCOUNTER — Ambulatory Visit: Payer: BC Managed Care – PPO | Admitting: Certified Nurse Midwife

## 2020-02-29 ENCOUNTER — Other Ambulatory Visit: Payer: Self-pay

## 2020-02-29 VITALS — BP 114/76 | HR 101 | Ht 66.0 in | Wt 225.6 lb

## 2020-02-29 DIAGNOSIS — R21 Rash and other nonspecific skin eruption: Secondary | ICD-10-CM

## 2020-02-29 DIAGNOSIS — Z803 Family history of malignant neoplasm of breast: Secondary | ICD-10-CM | POA: Diagnosis not present

## 2020-02-29 DIAGNOSIS — Z1231 Encounter for screening mammogram for malignant neoplasm of breast: Secondary | ICD-10-CM

## 2020-02-29 MED ORDER — NYSTATIN-TRIAMCINOLONE 100000-0.1 UNIT/GM-% EX CREA: 1.0000 "application " | TOPICAL_CREAM | Freq: Two times a day (BID) | CUTANEOUS | 0 refills | Status: DC

## 2020-02-29 NOTE — Patient Instructions (Signed)
Nystatin; Triamcinolone cream or ointment What is this medicine? NYSTATIN; TRIAMCINOLONE (nye STAT in; trye am SIN oh lone) is a combination of an antifungal medicine and a steroid. It is used to treat certain kinds of fungal or yeast infections of the skin. This medicine may be used for other purposes; ask your health care provider or pharmacist if you have questions. COMMON BRAND NAME(S): Myco-Triacet-II, Mycogen-II, Mycolog II, Mytrex, N.T.A. What should I tell my health care provider before I take this medicine? They need to know if you have any of these conditions:  large areas of burned or damaged skin  skin wasting or thinning  peripheral vascular disease or poor circulation  an unusual or allergic reaction to nystatin, triamcinolone, other corticosteroids, other medicines, foods, dyes, or preservatives  pregnant or trying to get pregnant  breast-feeding How should I use this medicine? This medicine is for external use only. Do not take by mouth. Follow the directions on the prescription label. Wash your hands before and after use. If treating hand or nail infections, wash hands before use only. Apply a thin layer of this medicine to the affected area and rub in gently. Do not use on healthy skin or over large areas of skin. Do not get this medicine in your eyes. If you do, rinse out with plenty of cool tap water. When applying to the groin area, apply a limited amount and do not use for longer than 2 weeks unless directed to by your doctor or health care professional. Do not cover or wrap the treated area with an airtight bandage (such as a plastic bandage). Use the full course of treatment prescribed, even if you think the infection is getting better. Use at regular intervals. Do not use your medicine more often than directed. Do not use this medicine for any condition other than the one for which it was prescribed. Talk to your pediatrician regarding the use of this medicine in  children. While this drug may be prescribed for selected conditions, precautions do apply. Children being treated in the diaper area should not wear tight-fitting diapers or plastic pants. Elderly patients are more likely to have damaged skin through aging, and this may increase side effects. This medicine should only be used for brief periods and infrequently in older patients. Overdosage: If you think you have taken too much of this medicine contact a poison control center or emergency room at once. NOTE: This medicine is only for you. Do not share this medicine with others. What if I miss a dose? If you miss a dose, use it as soon as you can. If it is almost time for your next dose, use only that dose. Do not use double or extra doses. What may interact with this medicine? Interactions are not expected. Do not use any other skin products on the affected area without telling your doctor or health care professional. This list may not describe all possible interactions. Give your health care provider a list of all the medicines, herbs, non-prescription drugs, or dietary supplements you use. Also tell them if you smoke, drink alcohol, or use illegal drugs. Some items may interact with your medicine. What should I watch for while using this medicine? Tell your doctor or health care professional if your symptoms do not start to get better within 1 week when treating the groin area or within 2 weeks when treating the feet. . Tell your doctor or health care professional if you develop sores or blisters that do  not heal properly. If your skin infection returns after stopping this medicine, contact your doctor or health care professional. If you are using this medicine to treat an infection in the groin area, do not wear underwear that is tight-fitting or made from synthetic fibers such as rayon or nylon. Instead, wear loose-fitting, cotton underwear. Also dry the area completely after bathing. What side  effects may I notice from receiving this medicine? Side effects that you should report to your doctor or health care professional as soon as possible:  burning or itching of the skin  dark red spots on the skin  loss of feeling on skin  painful, red, pus-filled blisters in hair follicles  skin infection  thinning of the skin or sunburn: more likely if applied to the face Side effects that usually do not require medical attention (report to your doctor or health care professional if they continue or are bothersome):  dry or peeling skin  skin irritation This list may not describe all possible side effects. Call your doctor for medical advice about side effects. You may report side effects to FDA at 1-800-FDA-1088. Where should I keep my medicine? Keep out of the reach of children. Store at room temperature between 15 and 30 degrees C (59 and 86 degrees F). Do not freeze. Throw away any unused medicine after the expiration date. NOTE: This sheet is a summary. It may not cover all possible information. If you have questions about this medicine, talk to your doctor, pharmacist, or health care provider.  2020 Elsevier/Gold Standard (2008-06-29 17:29:26)

## 2020-02-29 NOTE — Progress Notes (Signed)
Patient c/o rash on right breast x4 weeks, applied some Nystatin cream she had at home, the rash went away but then returned.

## 2020-03-02 NOTE — Progress Notes (Signed)
GYN ENCOUNTER NOTE  Subjective:       Katherine Callahan is a 44 y.o. G60P3003 female is here for gynecologic evaluation of the following issues:  1. Rash on right breast for the last four (4) months; used Nystatin cream, but rash returned after disappearing with use  Denies difficulty breathing or respiratory distress, chest pain, abdominal pain, excessive vaginal bleeding, dysuria, and leg pain or swelling.    Gynecologic History  Patient's last menstrual period was 02/08/2020 (approximate). Period Cycle (Days): 28 Period Duration (Days): 3 Period Pattern: Regular Menstrual Flow: Heavy, Light Menstrual Control: Tampon Dysmenorrhea: (!) Moderate Dysmenorrhea Symptoms: Cramping  Contraception: vasectomy  Last Pap: 08/2019. Results were: Neg/Neg  Last mammogram: 06/2017. Results were: normal, BI-RADS 1  Obstetric History  OB History  Gravida Para Term Preterm AB Living  3 3 3     3   SAB TAB Ectopic Multiple Live Births          3    # Outcome Date GA Lbr Len/2nd Weight Sex Delivery Anes PTL Lv  3 Term 02/14/11   6 lb 7 oz (2.92 kg) F Vag-Spont EPI  LIV  2 Term 03/20/06   7 lb 15 oz (3.6 kg) M Vag-Spont EPI N LIV  1 Term 08/15/00   7 lb 11 oz (3.487 kg) M Vag-Spont EPI N LIV    Past Medical History:  Diagnosis Date  . High cholesterol   . Increased risk of breast cancer   . Nasal congestion   . Pre-diabetes   . Vitamin D deficiency     Past Surgical History:  Procedure Laterality Date  . BREAST BIOPSY Left 2003   neg    Current Outpatient Medications on File Prior to Visit  Medication Sig Dispense Refill  . FLUoxetine (PROZAC) 10 MG capsule Take 1 capsule (10 mg total) by mouth daily. 90 capsule 3   No current facility-administered medications on file prior to visit.    Allergies  Allergen Reactions  . Aspirin   . Codeine   . Sulfa Antibiotics   . Penicillin G Rash    Social History   Socioeconomic History  . Marital status: Married    Spouse name: Not  on file  . Number of children: Not on file  . Years of education: Not on file  . Highest education level: Not on file  Occupational History  . Not on file  Tobacco Use  . Smoking status: Never Smoker  . Smokeless tobacco: Never Used  Substance and Sexual Activity  . Alcohol use: No  . Drug use: No  . Sexual activity: Yes    Birth control/protection: Other-see comments    Comment: husband-vasectomy  Other Topics Concern  . Not on file  Social History Narrative  . Not on file   Social Determinants of Health   Financial Resource Strain:   . Difficulty of Paying Living Expenses:   Food Insecurity:   . Worried About Charity fundraiser in the Last Year:   . Arboriculturist in the Last Year:   Transportation Needs:   . Film/video editor (Medical):   Marland Kitchen Lack of Transportation (Non-Medical):   Physical Activity:   . Days of Exercise per Week:   . Minutes of Exercise per Session:   Stress:   . Feeling of Stress :   Social Connections:   . Frequency of Communication with Friends and Family:   . Frequency of Social Gatherings with Friends and Family:   .  Attends Religious Services:   . Active Member of Clubs or Organizations:   . Attends Banker Meetings:   Marland Kitchen Marital Status:   Intimate Partner Violence:   . Fear of Current or Ex-Partner:   . Emotionally Abused:   Marland Kitchen Physically Abused:   . Sexually Abused:     Family History  Problem Relation Age of Onset  . Breast cancer Other   . Cancer Mother        breast  . Breast cancer Mother 53  . Heart disease Father 25       MI  . Heart attack Father   . Ovarian cancer Neg Hx   . Colon cancer Neg Hx     The following portions of the patient's history were reviewed and updated as appropriate: allergies, current medications, past family history, past medical history, past social history, past surgical history and problem list.  Review of Systems  ROS negative except as noted above. Information obtained  from patient.   Objective:   BP 114/76   Pulse (!) 101   Ht 5\' 6"  (1.676 m)   Wt 225 lb 9.6 oz (102.3 kg)   LMP 02/08/2020 (Approximate)   BMI 36.41 kg/m    CONSTITUTIONAL: Well-developed, well-nourished female in no acute distress.   BREASTS: breasts appear normal, no suspicious masses, no nipple changes or axillary nodes. Red, blanchable rash approximately four (4) centimeters in diameter present between three (3) o'clock and five (5) o'clock.   Assessment:   1. Skin rash  2. Screening mammogram, encounter for  - MM 3D SCREEN BREAST BILATERAL; Future  3. Family history of breast cancer in mother  - MM 3D SCREEN BREAST BILATERAL; Future  Plan:   Rx Mycolog, see orders.   Mammogram ordered, see chart.   Reviewed red flag symptoms and when to call.   RTC if symptoms worsen or fail to improve.    02/10/2020, CNM [facility], [facility]

## 2020-03-16 ENCOUNTER — Ambulatory Visit: Payer: BC Managed Care – PPO

## 2020-03-19 ENCOUNTER — Ambulatory Visit: Payer: BC Managed Care – PPO

## 2020-04-01 ENCOUNTER — Ambulatory Visit
Admission: RE | Admit: 2020-04-01 | Discharge: 2020-04-01 | Disposition: A | Discharge status: Home / Self Care | Payer: BC Managed Care – PPO | Source: Ambulatory Visit | Attending: Certified Nurse Midwife | Admitting: Certified Nurse Midwife

## 2020-04-01 DIAGNOSIS — Z803 Family history of malignant neoplasm of breast: Secondary | ICD-10-CM | POA: Insufficient documentation

## 2020-04-01 DIAGNOSIS — Z1231 Encounter for screening mammogram for malignant neoplasm of breast: Secondary | ICD-10-CM | POA: Diagnosis not present

## 2020-04-03 ENCOUNTER — Ambulatory Visit: Payer: BC Managed Care – PPO

## 2020-10-24 ENCOUNTER — Telehealth: Payer: Self-pay

## 2020-10-24 NOTE — Telephone Encounter (Signed)
Patient called in wanting to know what blood type she is, could you please advise?

## 2020-10-25 ENCOUNTER — Telehealth: Payer: Self-pay

## 2020-10-25 NOTE — Telephone Encounter (Signed)
Called pt- no answer mychart message sent

## 2020-11-15 IMAGING — MG DIGITAL SCREENING BILAT W/ TOMO W/ CAD
6 of 12 series · 6 of 36 positions shown · non-contrast
Comparison: Previous exam(s).

CLINICAL DATA: Screening.

EXAM:
DIGITAL SCREENING BILATERAL MAMMOGRAM WITH TOMO AND CAD

[L MLO synth-2D (1 of 2)]
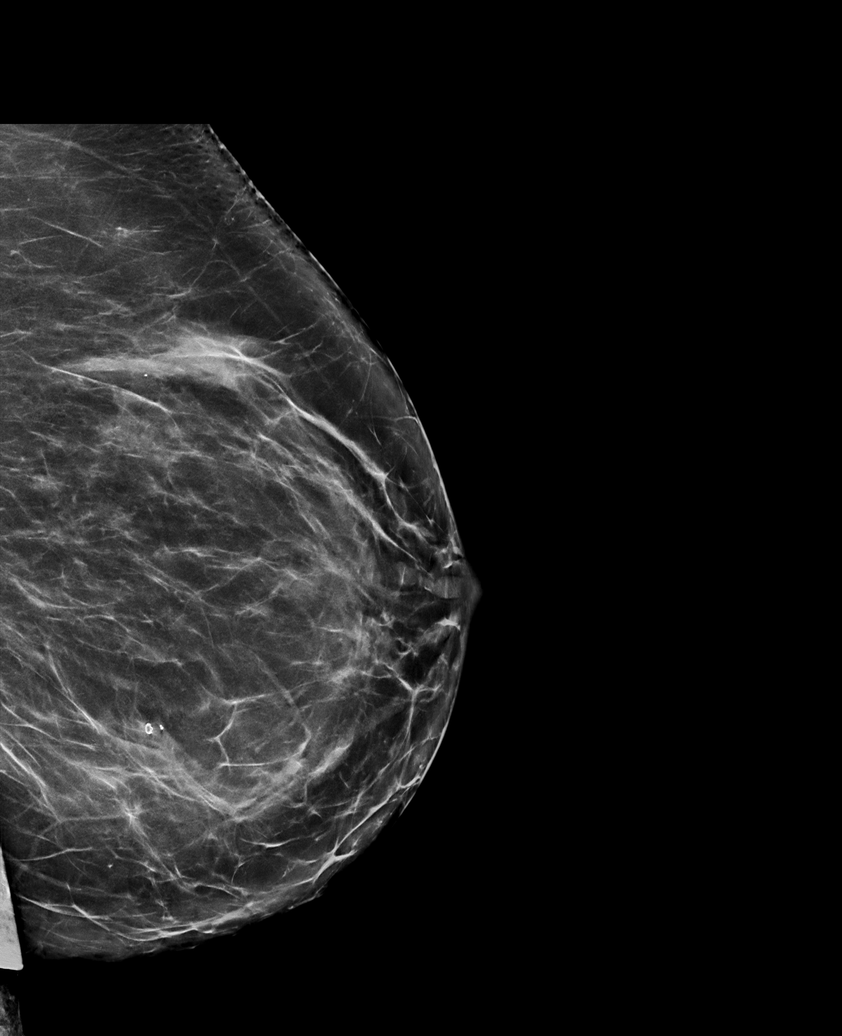

[R MLO synth-2D (1 of 2)]
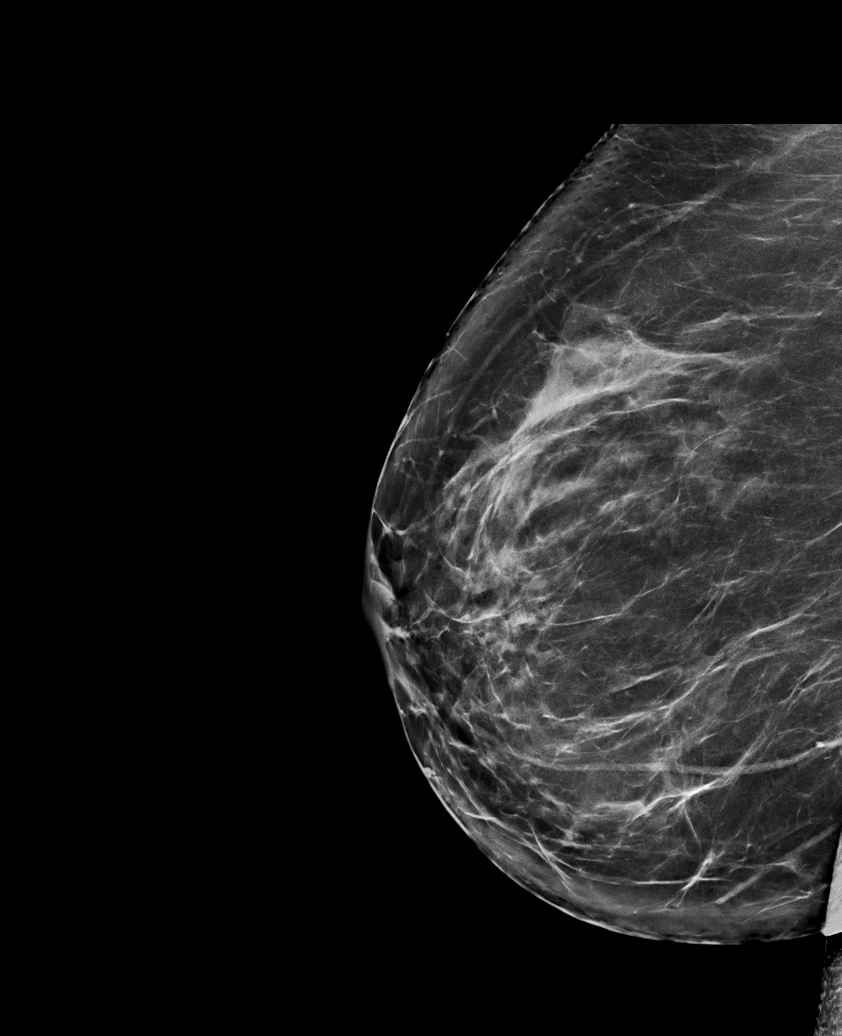

[L MLO synth-2D (2 of 2)]
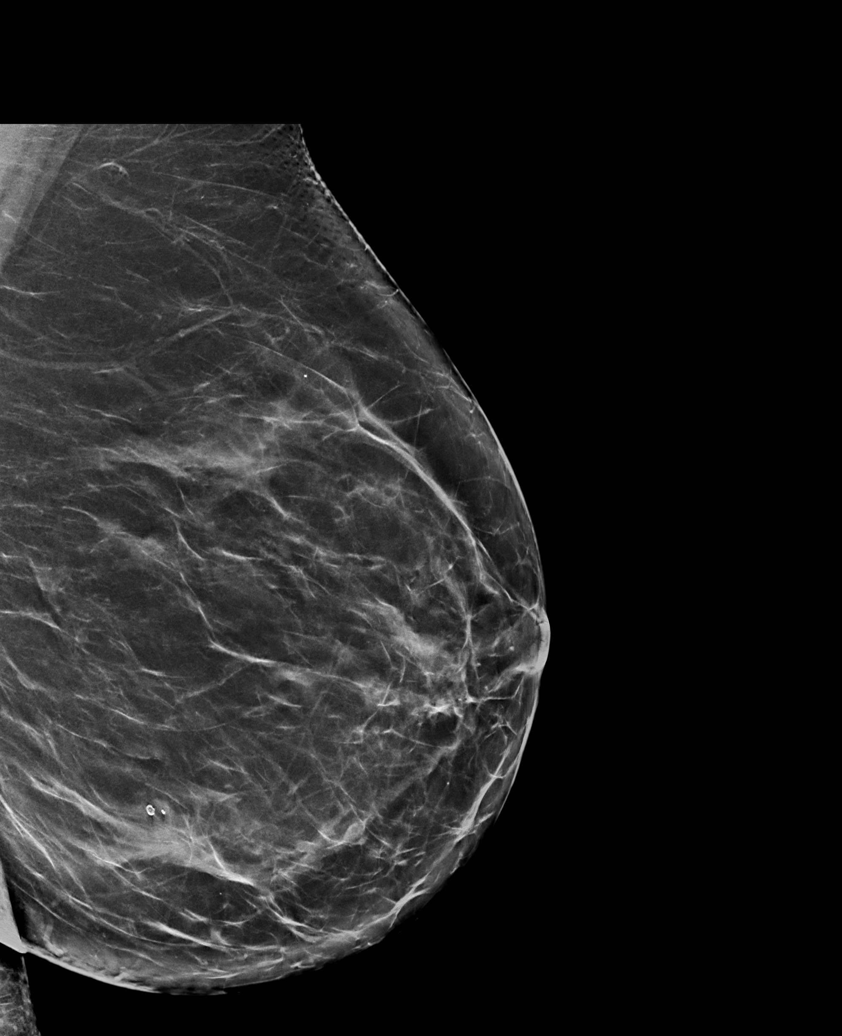

[R MLO synth-2D (2 of 2)]
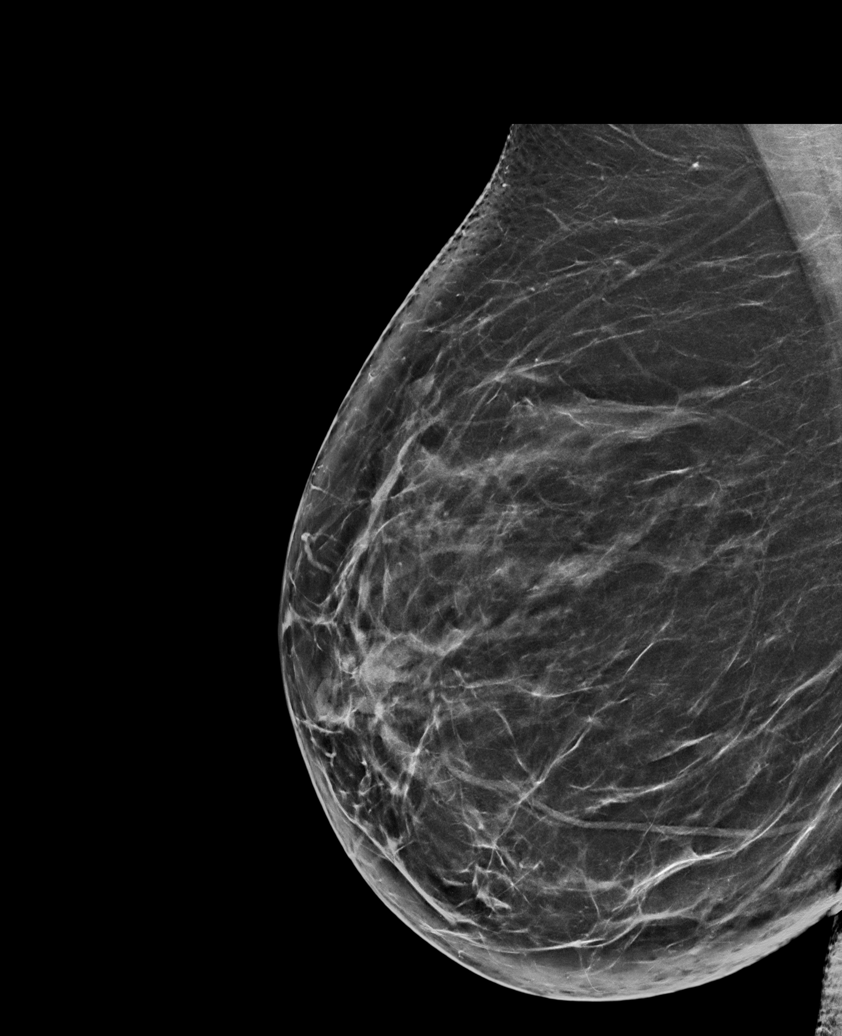

[L CC synth-2D]
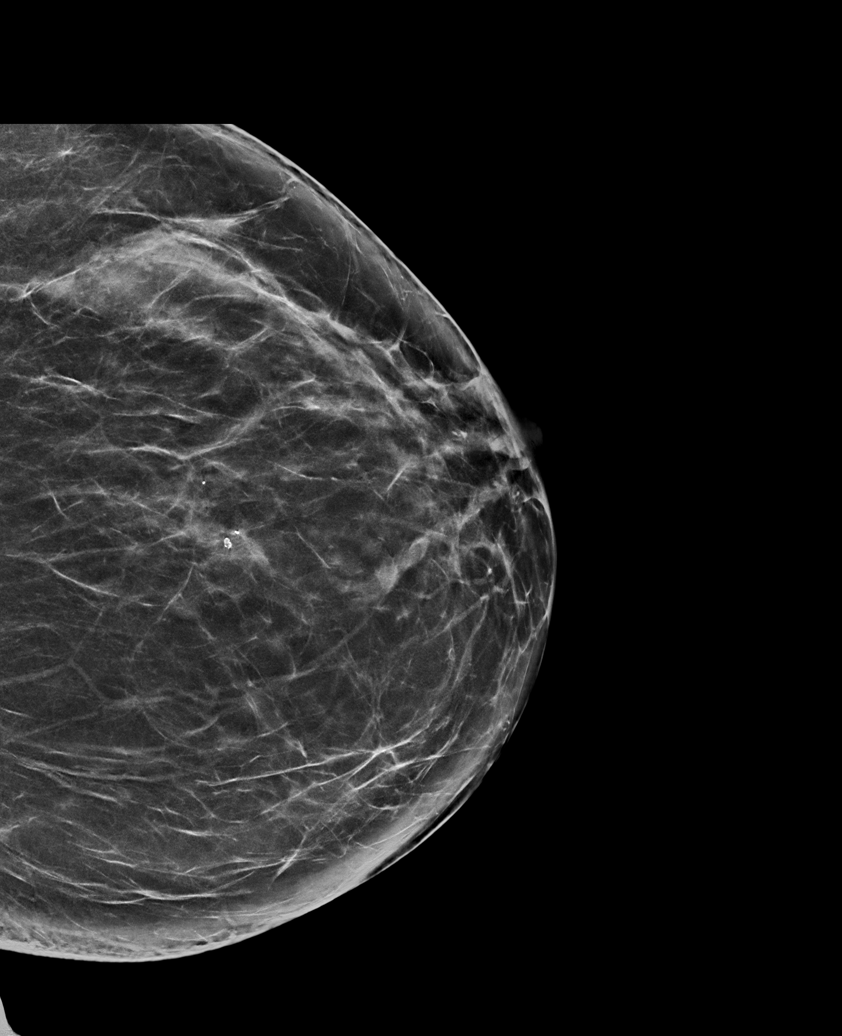

[R CC synth-2D]
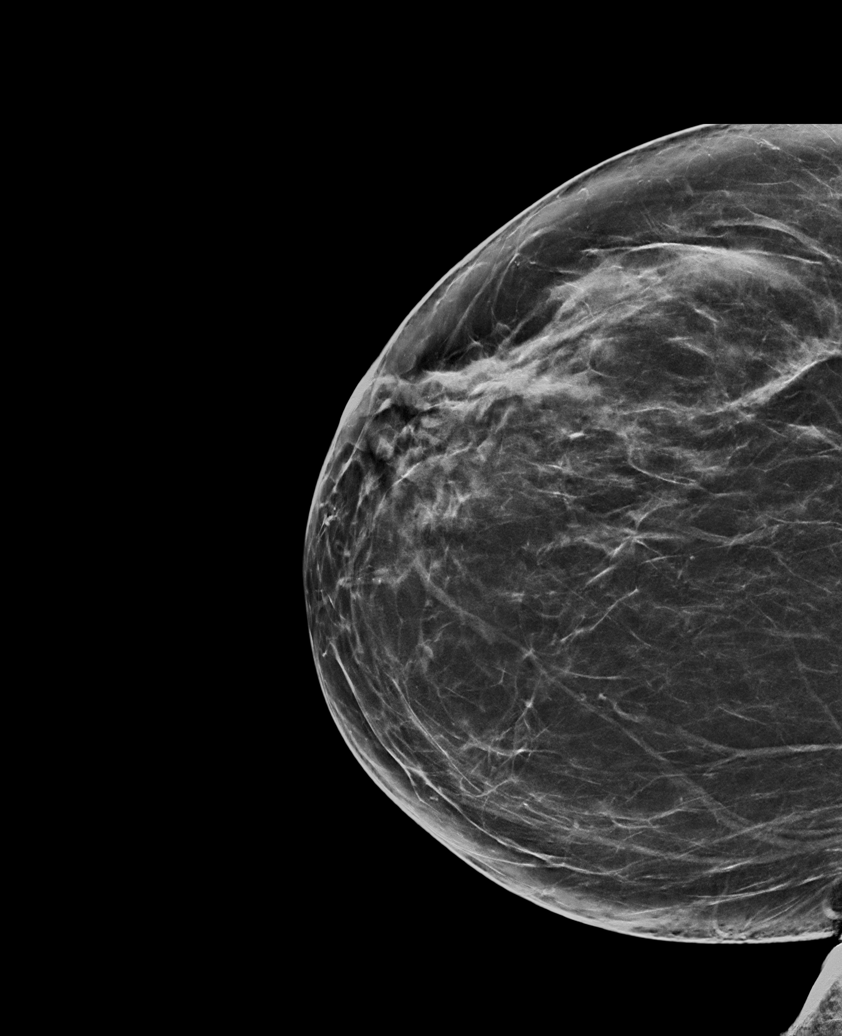

[6 of 36 positions shown; findings below may reference images not displayed]

ACR Breast Density Category c: The breast tissue is heterogeneously
dense, which may obscure small masses.
FINDINGS: There are no findings suspicious for malignancy. Images were
processed with CAD.
IMPRESSION: No mammographic evidence of malignancy. A result letter of this
screening mammogram will be mailed directly to the patient.

RECOMMENDATION:
Screening mammogram in one year. (Code:[id number])

BI-RADS CATEGORY  1: Negative.

## 2021-11-04 ENCOUNTER — Other Ambulatory Visit: Payer: Self-pay

## 2021-11-04 ENCOUNTER — Ambulatory Visit (INDEPENDENT_AMBULATORY_CARE_PROVIDER_SITE_OTHER): Payer: BC Managed Care – PPO | Admitting: Obstetrics and Gynecology

## 2021-11-04 ENCOUNTER — Encounter: Payer: Self-pay | Admitting: Obstetrics and Gynecology

## 2021-11-04 VITALS — BP 141/98 | HR 95 | Ht 66.0 in | Wt 226.0 lb

## 2021-11-04 DIAGNOSIS — E038 Other specified hypothyroidism: Secondary | ICD-10-CM | POA: Diagnosis not present

## 2021-11-04 DIAGNOSIS — E063 Autoimmune thyroiditis: Secondary | ICD-10-CM | POA: Diagnosis not present

## 2021-11-04 DIAGNOSIS — R5383 Other fatigue: Secondary | ICD-10-CM | POA: Diagnosis not present

## 2021-11-04 DIAGNOSIS — R21 Rash and other nonspecific skin eruption: Secondary | ICD-10-CM | POA: Diagnosis not present

## 2021-11-04 NOTE — Progress Notes (Signed)
    GYNECOLOGY PROGRESS NOTE  Subjective:    Patient ID: Katherine Callahan, female    DOB: 1976-07-18, 45 y.o.   MRN: [id number]  HPI  Patient is a 45 y.o. G41P3003 female who presents for right breast rash.  Patient complaints: Right breast rash. Reoccurring red and itchy rash on the anterior and inner portion of breast. Patient states the rash has no relief with nystatin, slight relief with lavender oil for itching. Patient concerned that the rash has been reoccurring of and on for approx. 1 year. Rash usually lasts for ~ 5-6 weeks and then resolves. Occurs in the same place each time. Sometimes is associated with pain and itching.  Excessive exhaustion. Patient states she has had little to no energy doing her daily activities.  The following portions of the patient's history were reviewed and updated as appropriate: allergies, current medications, past family history, past medical history, past social history, past surgical history, and problem list.  Review of Systems Pertinent items noted in HPI and remainder of comprehensive ROS otherwise negative.   Objective:   Blood pressure (!) 141/98, pulse 95, height 5\' 6"  (1.676 m), weight 226 lb (102.5 kg), last menstrual period 10/22/2021.   General appearance: alert, cooperative, appears stated age, and no distress Breasts: left breast normal without mass, skin or nipple changes or axillary nodes. Right breast with few scaling papules present across breast. No nipple changes, mass, or axillary nodes.    Assessment:   1. Skin rash   2. Hypothyroidism due to Hashimoto's thyroiditis   3. Other fatigue      Plan:   1. Skin rash - Unclear cause. Rash mostly resolved at this time. Encourage to f/u when rash is active. Concerning for possible shingles or other dermatopic rash at it is reoccuring and localized to same area. Denies any recent skin irritants or exposures. Advised on use of Hydrocortisone instead of Nystatin if rash is papular, and to  notify MD when rash occurs again. May also benefit from referral to Dermatology if persistent and not related to shingles.   2. Hypothyroidism due to Hashimoto's thyroiditis - Thyroid Panel With TSH - Reports that she has not been taking her medication for some time as she does not like to take meds and forgets.   3. Other fatigue - Unclear cause.  - Vitamin D (25 hydroxy) - Vitamin B12 - Thyroid Panel With TSH   A total of 20 minutes were spent face-to-face with the patient during this encounter and over half of that time involved counseling and coordination of care.   13/01/2021, MD [facility]

## 2021-11-05 ENCOUNTER — Other Ambulatory Visit: Payer: Self-pay | Admitting: Obstetrics and Gynecology

## 2021-11-05 LAB — THYROID PANEL WITH TSH
Free Thyroxine Index: 1.3 (ref 1.2–4.9)
T3 Uptake Ratio: 20 % — ABNORMAL LOW (ref 24–39)
T4, Total: 6.6 ug/dL (ref 4.5–12.0)
TSH: 6.02 u[IU]/mL — ABNORMAL HIGH (ref 0.450–4.500)

## 2021-11-05 LAB — VITAMIN B12: Vitamin B-12: 313 pg/mL (ref 232–1245)

## 2021-11-05 LAB — VITAMIN D 25 HYDROXY (VIT D DEFICIENCY, FRACTURES): Vit D, 25-Hydroxy: 19.7 ng/mL — ABNORMAL LOW (ref 30.0–100.0)

## 2021-11-05 MED ORDER — VITAMIN D (ERGOCALCIFEROL) 1.25 MG (50000 UNIT) PO CAPS: 50000.0000 [IU] | ORAL_CAPSULE | ORAL | 0 refills | Status: DC

## 2021-11-06 MED ORDER — LEVOTHYROXINE SODIUM 25 MCG PO TABS: 25.0000 ug | ORAL_TABLET | Freq: Every day | ORAL | 3 refills | Status: DC

## 2022-02-03 ENCOUNTER — Encounter: Payer: Self-pay | Admitting: Obstetrics and Gynecology

## 2022-06-17 ENCOUNTER — Encounter: Payer: Self-pay | Admitting: Obstetrics and Gynecology

## 2022-06-17 DIAGNOSIS — N939 Abnormal uterine and vaginal bleeding, unspecified: Secondary | ICD-10-CM

## 2022-06-17 DIAGNOSIS — E038 Other specified hypothyroidism: Secondary | ICD-10-CM

## 2022-06-25 MED ORDER — NORETHINDRONE ACETATE 5 MG PO TABS
5.0000 mg | ORAL_TABLET | Freq: Every day | ORAL | 2 refills | Status: DC
Start: 2022-06-25 — End: 2022-07-14

## 2022-07-03 ENCOUNTER — Encounter: Payer: Self-pay | Admitting: Family Medicine

## 2022-07-03 NOTE — Addendum Note (Signed)
Addended by: Fabian November on: 07/03/2022 09:04 PM   Modules accepted: Orders

## 2022-07-08 ENCOUNTER — Encounter: Payer: Self-pay | Admitting: Obstetrics and Gynecology

## 2022-07-13 ENCOUNTER — Other Ambulatory Visit: Payer: BC Managed Care – PPO | Admitting: Obstetrics and Gynecology

## 2022-07-13 DIAGNOSIS — E038 Other specified hypothyroidism: Secondary | ICD-10-CM

## 2022-07-13 DIAGNOSIS — N939 Abnormal uterine and vaginal bleeding, unspecified: Secondary | ICD-10-CM

## 2022-07-14 ENCOUNTER — Ambulatory Visit: Payer: BC Managed Care – PPO | Admitting: Obstetrics and Gynecology

## 2022-07-14 ENCOUNTER — Encounter: Payer: Self-pay | Admitting: Obstetrics and Gynecology

## 2022-07-14 VITALS — BP 149/95 | HR 87 | Ht 66.0 in | Wt 230.7 lb

## 2022-07-14 DIAGNOSIS — N939 Abnormal uterine and vaginal bleeding, unspecified: Secondary | ICD-10-CM

## 2022-07-14 DIAGNOSIS — Z1231 Encounter for screening mammogram for malignant neoplasm of breast: Secondary | ICD-10-CM

## 2022-07-14 LAB — FSH/LH
FSH: 9.4 m[IU]/mL
LH: 6.4 m[IU]/mL

## 2022-07-14 LAB — THYROID PANEL WITH TSH
Free Thyroxine Index: 1.3 (ref 1.2–4.9)
T3 Uptake Ratio: 20 % — ABNORMAL LOW (ref 24–39)
T4, Total: 6.3 ug/dL (ref 4.5–12.0)
TSH: 6.26 u[IU]/mL — ABNORMAL HIGH (ref 0.450–4.500)

## 2022-07-14 LAB — CBC
Hematocrit: 38.4 % (ref 34.0–46.6)
Hemoglobin: 13 g/dL (ref 11.1–15.9)
MCH: 29.7 pg (ref 26.6–33.0)
MCHC: 33.9 g/dL (ref 31.5–35.7)
MCV: 88 fL (ref 79–97)
Platelets: 257 10*3/uL (ref 150–450)
RBC: 4.37 x10E6/uL (ref 3.77–5.28)
RDW: 13.1 % (ref 11.7–15.4)
WBC: 6.6 10*3/uL (ref 3.4–10.8)

## 2022-07-14 LAB — ESTRADIOL: Estradiol: 49 pg/mL

## 2022-07-14 LAB — PROGESTERONE: Progesterone: 0.1 ng/mL

## 2022-07-14 MED ORDER — TRANEXAMIC ACID 650 MG PO TABS: 1300.0000 mg | ORAL_TABLET | Freq: Three times a day (TID) | ORAL | 2 refills | Status: DC

## 2022-07-14 NOTE — Progress Notes (Signed)
    GYNECOLOGY PROGRESS NOTE  Subjective:    Patient ID: Katherine Callahan, female    DOB: [birth year], 46 y.o.   MRN: [id number]  HPI  Patient is a 46 y.o. G46P3003 female who presents for complaints of long, frequent, heavy cycles. Her LMP was 06/08/2022.  She reports that she had bleeding for ~ 15 days, then the bleeding stopped x 4 days and resumed again.  She was prescribed Aygestin during this time, however notes that she only utilized for a short time due to complaints of significant nausea.  Bleeding just stopped 2 days ago.  Patient had labs performed yesterday for workup of bleeding.   The following portions of the patient's history were reviewed and updated as appropriate: allergies, current medications, past family history, past medical history, past social history, past surgical history, and problem list.  Review of Systems Pertinent items are noted in HPI.   Objective:   Blood pressure (!) 149/95, pulse 87, height 5\' 6"  (1.676 m), weight 230 lb 11.2 oz (104.6 kg), last menstrual period 06/08/2022.  Body mass index is 37.24 kg/m. General appearance: alert, cooperative, and no distress Remainder of exam deferred.    Labs:  Results for orders placed or performed in visit on 07/13/22  Thyroid Panel With TSH  Result Value Ref Range   TSH 6.260 (H) 0.450 - 4.500 uIU/mL   T4, Total 6.3 4.5 - 12.0 ug/dL   T3 Uptake Ratio 20 (L) 24 - 39 %   Free Thyroxine Index 1.3 1.2 - 4.9  Estradiol  Result Value Ref Range   Estradiol 49.0 pg/mL  CBC  Result Value Ref Range   WBC 6.6 3.4 - 10.8 x10E3/uL   RBC 4.37 3.77 - 5.28 x10E6/uL   Hemoglobin 13.0 11.1 - 15.9 g/dL   Hematocrit 07/15/22 46.5 - 46.6 %   MCV 88 79 - 97 fL   MCH 29.7 26.6 - 33.0 pg   MCHC 33.9 31.5 - 35.7 g/dL   RDW 68.1 27.5 - 17.0 %   Platelets 257 150 - 450 x10E3/uL  Progesterone  Result Value Ref Range   Progesterone 0.1 ng/mL  FSH/LH  Result Value Ref Range   LH 6.4 mIU/mL   FSH 9.4 mIU/mL    Assessment:   1.  Abnormal uterine bleeding   2. Breast cancer screening by mammogram      Plan:  1. Abnormal uterine bleeding - Discussed potential causes of AUB.  Labs show no evidence of anemia despite patient's report of heavy cycle.  Discussed treatment options, including hormonal manipulation (low-dose OCPs), Mirena IUD, TXA.  Patient desires to try TXA.  If no response to medication, pelvic ultrasound to evaluate for any structural gynecologic abnormalities will be ordered.  Will contact patient with these results and plans for further evaluation/management.    2. Breast cancer screening by mammogram - Patient desires to have screening, notes she is due.  Will order.    To follow up in 2 months for annual exam and can f/u with bleeding at that time.    01.7, MD [facility]

## 2022-07-22 ENCOUNTER — Encounter: Payer: BC Managed Care – PPO | Admitting: Family Medicine

## 2022-07-22 ENCOUNTER — Ambulatory Visit (INDEPENDENT_AMBULATORY_CARE_PROVIDER_SITE_OTHER): Payer: BC Managed Care – PPO

## 2022-07-22 DIAGNOSIS — N939 Abnormal uterine and vaginal bleeding, unspecified: Secondary | ICD-10-CM

## 2022-07-29 ENCOUNTER — Ambulatory Visit
Admission: RE | Admit: 2022-07-29 | Discharge: 2022-07-29 | Disposition: A | Discharge status: Home / Self Care | Payer: BC Managed Care – PPO | Source: Ambulatory Visit | Attending: Obstetrics and Gynecology | Admitting: Obstetrics and Gynecology

## 2022-07-29 DIAGNOSIS — Z1231 Encounter for screening mammogram for malignant neoplasm of breast: Secondary | ICD-10-CM | POA: Diagnosis present

## 2022-07-30 ENCOUNTER — Other Ambulatory Visit: Payer: Self-pay

## 2022-07-30 ENCOUNTER — Encounter: Payer: Self-pay | Admitting: Obstetrics and Gynecology

## 2022-07-30 MED ORDER — NORETHINDRONE ACETATE 5 MG PO TABS
5.0000 mg | ORAL_TABLET | Freq: Every day | ORAL | 2 refills | Status: DC
Start: 2022-07-30 — End: 2022-10-13

## 2022-09-14 ENCOUNTER — Telehealth: Payer: Self-pay

## 2022-09-14 NOTE — Telephone Encounter (Signed)
Called pt to confirm appointment for tomorrow.  Pt verified appt date/time.   

## 2022-09-14 NOTE — Telephone Encounter (Signed)
Called pt to confirm appointment for tomorrow.  Left message for pt to call and confirm appointment by 1700 today  

## 2022-09-16 ENCOUNTER — Encounter: Payer: BC Managed Care – PPO | Admitting: Family Medicine

## 2022-09-16 ENCOUNTER — Other Ambulatory Visit (HOSPITAL_COMMUNITY)
Admission: RE | Admit: 2022-09-16 | Discharge: 2022-09-16 | Disposition: A | Discharge status: Home / Self Care | Payer: BC Managed Care – PPO | Source: Ambulatory Visit | Attending: Family Medicine | Admitting: Family Medicine

## 2022-09-16 ENCOUNTER — Ambulatory Visit: Payer: BC Managed Care – PPO | Admitting: Family Medicine

## 2022-09-16 ENCOUNTER — Encounter: Payer: Self-pay | Admitting: Family Medicine

## 2022-09-16 VITALS — BP 147/96 | HR 96 | Wt 232.0 lb

## 2022-09-16 DIAGNOSIS — Z124 Encounter for screening for malignant neoplasm of cervix: Secondary | ICD-10-CM

## 2022-09-16 DIAGNOSIS — E063 Autoimmune thyroiditis: Secondary | ICD-10-CM

## 2022-09-16 DIAGNOSIS — Z01419 Encounter for gynecological examination (general) (routine) without abnormal findings: Secondary | ICD-10-CM

## 2022-09-16 DIAGNOSIS — F4329 Adjustment disorder with other symptoms: Secondary | ICD-10-CM | POA: Diagnosis not present

## 2022-09-16 DIAGNOSIS — Z87898 Personal history of other specified conditions: Secondary | ICD-10-CM

## 2022-09-16 DIAGNOSIS — I10 Essential (primary) hypertension: Secondary | ICD-10-CM

## 2022-09-16 DIAGNOSIS — N939 Abnormal uterine and vaginal bleeding, unspecified: Secondary | ICD-10-CM | POA: Diagnosis not present

## 2022-09-16 DIAGNOSIS — E038 Other specified hypothyroidism: Secondary | ICD-10-CM

## 2022-09-16 DIAGNOSIS — E782 Mixed hyperlipidemia: Secondary | ICD-10-CM

## 2022-09-16 LAB — CBC
Hematocrit: 41.2 % (ref 34.0–46.6)
Hemoglobin: 14.2 g/dL (ref 11.1–15.9)
MCH: 29.7 pg (ref 26.6–33.0)
MCHC: 34.5 g/dL (ref 31.5–35.7)
MCV: 86 fL (ref 79–97)
Platelets: 316 10*3/uL (ref 150–450)
RBC: 4.78 x10E6/uL (ref 3.77–5.28)
RDW: 12.6 % (ref 11.7–15.4)
WBC: 8.9 10*3/uL (ref 3.4–10.8)

## 2022-09-16 LAB — HEMOGLOBIN A1C
Est. average glucose Bld gHb Est-mCnc: 120 mg/dL
Hgb A1c MFr Bld: 5.8 % — ABNORMAL HIGH (ref 4.8–5.6)

## 2022-09-16 NOTE — Progress Notes (Unsigned)
Annual  LMP: 09/05/22 Hx of heavy periods pt states she has been doing better had one Crist[place] of heavy bleeding.took lysteda and bleeding got better. Last Pap: 09/07/2019 Mammogram:07/29/22 Family Hx of Breast Cancer  CC: Recurrent rash right breast that is very itchy. Onset 1-2 wks ago

## 2022-09-16 NOTE — Progress Notes (Incomplete)
Subjective:     Katherine Callahan is a 46 y.o. female and is here for a comprehensive physical exam. The patient reports {problems:16946}.   {Common ambulatory SmartLinks:19316}  Review of Systems {ros; complete:30496}   Objective:    {Exam, Complete:9840783149}    Assessment:    Healthy female exam. ***     Plan:     See After Visit Summary for Counseling Recommendations

## 2022-09-17 ENCOUNTER — Encounter: Payer: Self-pay | Admitting: Family Medicine

## 2022-09-17 DIAGNOSIS — F4329 Adjustment disorder with other symptoms: Secondary | ICD-10-CM | POA: Insufficient documentation

## 2022-09-17 DIAGNOSIS — N939 Abnormal uterine and vaginal bleeding, unspecified: Secondary | ICD-10-CM | POA: Insufficient documentation

## 2022-09-17 DIAGNOSIS — I10 Essential (primary) hypertension: Secondary | ICD-10-CM | POA: Insufficient documentation

## 2022-09-17 LAB — COMPREHENSIVE METABOLIC PANEL
ALT: 43 IU/L — ABNORMAL HIGH (ref 0–32)
AST: 57 IU/L — ABNORMAL HIGH (ref 0–40)
Albumin/Globulin Ratio: 1.6 (ref 1.2–2.2)
Albumin: 4.6 g/dL (ref 3.9–4.9)
Alkaline Phosphatase: 71 IU/L (ref 44–121)
BUN/Creatinine Ratio: 14 (ref 9–23)
BUN: 11 mg/dL (ref 6–24)
Bilirubin Total: 0.2 mg/dL (ref 0.0–1.2)
CO2: 22 mmol/L (ref 20–29)
Calcium: 9.6 mg/dL (ref 8.7–10.2)
Chloride: 99 mmol/L (ref 96–106)
Creatinine, Ser: 0.79 mg/dL (ref 0.57–1.00)
Globulin, Total: 2.8 g/dL (ref 1.5–4.5)
Glucose: 79 mg/dL (ref 70–99)
Potassium: 4.2 mmol/L (ref 3.5–5.2)
Sodium: 140 mmol/L (ref 134–144)
Total Protein: 7.4 g/dL (ref 6.0–8.5)
eGFR: 92 mL/min/{1.73_m2} (ref >59)

## 2022-09-17 LAB — LIPID PANEL
Chol/HDL Ratio: 5.1 ratio — ABNORMAL HIGH (ref 0.0–4.4)
Cholesterol, Total: 249 mg/dL — ABNORMAL HIGH (ref 100–199)
HDL: 49 mg/dL (ref >39)
LDL Chol Calc (NIH): 146 mg/dL — ABNORMAL HIGH (ref 0–99)
Triglycerides: 295 mg/dL — ABNORMAL HIGH (ref 0–149)
VLDL Cholesterol Cal: 54 mg/dL — ABNORMAL HIGH (ref 5–40)

## 2022-09-17 LAB — T4, FREE: Free T4: 0.77 ng/dL — ABNORMAL LOW (ref 0.82–1.77)

## 2022-09-17 LAB — T3, FREE: T3, Free: 3.1 pg/mL (ref 2.0–4.4)

## 2022-09-17 LAB — VITAMIN D 25 HYDROXY (VIT D DEFICIENCY, FRACTURES): Vit D, 25-Hydroxy: 28.4 ng/mL — ABNORMAL LOW (ref 30.0–100.0)

## 2022-09-17 LAB — TSH: TSH: 7.28 u[IU]/mL — ABNORMAL HIGH (ref 0.450–4.500)

## 2022-09-17 NOTE — Assessment & Plan Note (Addendum)
68341 -  In last 2 office visits, BP is elevated. Has FH as well. Discussed DASH diet and ways to improve this. May need PCP referral as well.

## 2022-09-17 NOTE — Assessment & Plan Note (Addendum)
02409 - Discussed at length. U/S reveals adenomyosis, reviewed how/why occurs, usual treatment options, and usual life cycle with menopause. She will continue Lysteda if needed during her cycles.

## 2022-09-17 NOTE — Assessment & Plan Note (Addendum)
99204 - Likely inherited - Discussed cholesterol in diet, and ways to decrease this.

## 2022-09-17 NOTE — Assessment & Plan Note (Addendum)
99204 - repeat testing, briefly discussed weight management issues, GLP-1 treatment options, diet, exercise, how to easily increase activity, and smaller portion sizes.

## 2022-09-17 NOTE — Assessment & Plan Note (Addendum)
92330 - On no meds at present--repeat TFTs. May contribute to her weight gain.

## 2022-09-17 NOTE — Assessment & Plan Note (Addendum)
99204 - Does not seem to interfere with her sleep. Notes she eats in response to stress, discussed alternatives including drinking water, chewing sugar free gum and 10 min. Walking. She asked about SSRI as possible treatment, and we discussed usual onset of action, how it might impact her. We discussed other stress management solutions including mindfulness, meditation, yoga, regular exercise as alternatives as she does not like to take medications.

## 2022-09-22 LAB — CYTOLOGY - PAP
Adequacy: ABSENT
Comment: NEGATIVE
Diagnosis: NEGATIVE
High risk HPV: NEGATIVE

## 2022-09-24 ENCOUNTER — Encounter: Payer: BC Managed Care – PPO | Admitting: Obstetrics and Gynecology

## 2022-09-25 ENCOUNTER — Encounter: Payer: Self-pay | Admitting: Obstetrics and Gynecology

## 2022-10-13 ENCOUNTER — Encounter: Payer: Self-pay | Admitting: Family

## 2022-10-13 ENCOUNTER — Ambulatory Visit: Payer: BC Managed Care – PPO | Admitting: Family

## 2022-10-13 VITALS — BP 140/110 | HR 88 | Temp 99.0°F | Ht 66.0 in | Wt 233.0 lb

## 2022-10-13 DIAGNOSIS — R7303 Prediabetes: Secondary | ICD-10-CM

## 2022-10-13 DIAGNOSIS — E782 Mixed hyperlipidemia: Secondary | ICD-10-CM

## 2022-10-13 DIAGNOSIS — E038 Other specified hypothyroidism: Secondary | ICD-10-CM | POA: Diagnosis not present

## 2022-10-13 DIAGNOSIS — E063 Autoimmune thyroiditis: Secondary | ICD-10-CM

## 2022-10-13 DIAGNOSIS — N939 Abnormal uterine and vaginal bleeding, unspecified: Secondary | ICD-10-CM

## 2022-10-13 DIAGNOSIS — Z1211 Encounter for screening for malignant neoplasm of colon: Secondary | ICD-10-CM | POA: Diagnosis not present

## 2022-10-13 DIAGNOSIS — E669 Obesity, unspecified: Secondary | ICD-10-CM | POA: Insufficient documentation

## 2022-10-13 DIAGNOSIS — E559 Vitamin D deficiency, unspecified: Secondary | ICD-10-CM

## 2022-10-13 DIAGNOSIS — Z9189 Other specified personal risk factors, not elsewhere classified: Secondary | ICD-10-CM

## 2022-10-13 DIAGNOSIS — I10 Essential (primary) hypertension: Secondary | ICD-10-CM | POA: Diagnosis not present

## 2022-10-13 MED ORDER — LEVOTHYROXINE SODIUM 50 MCG PO TABS: 50.0000 ug | ORAL_TABLET | Freq: Every day | ORAL | 3 refills | Status: DC

## 2022-10-13 MED ORDER — AMLODIPINE BESYLATE 5 MG PO TABS: 5.0000 mg | ORAL_TABLET | Freq: Every day | ORAL | 0 refills | Status: DC

## 2022-10-13 NOTE — Assessment & Plan Note (Signed)
Pt advised of the following: Work on a diabetic diet, try to incorporate exercise at least 20-30 a [place] for 3 days a week or more.   

## 2022-10-13 NOTE — Progress Notes (Signed)
New Patient Office Visit  Subjective:  Patient ID: Katherine Callahan, female    DOB: 11-09-1976  Age: 46 y.o. MRN: [id number]  CC:  Chief Complaint  Patient presents with   Establish Care    Not fasting    HPI Katherine Callahan is here to establish care as a new patient.  Prior provider was: years ago. Not since [facility]. Saw Melody Shambley back in 2017.  GYN: Darron Doom  Pt is without acute concerns.   Mom with breast cancer, however tested negative for gene.  Declines flu vaccine.  Mammogram: just completed 9/23 negative per pt   chronic concerns:  Elevated BP without dx htn: over the past six months stays high during last visits including when she went to her dentist months ago for a filling. She denies cp palp and or sob.  At times she may feel a little flutter in her center of her chest and goes up to her neck, and will feel slightly lightheaded, but this is only about every few months not that often.   Heavy menses: lysteda, for heavy periods, recently started with Dr. Kennon Rounds. Typically will go through tampon and pad in one hour.   Hypothyroid: synthroid 25 mcg once daily.   Vitamin d def: not currently supplementing.  Last vitamin D Lab Results  Component Value Date   VD25OH 28.4 (L) 09/16/2022    Wt Readings from Last 3 Encounters:  10/13/22 233 lb (105.7 kg)  09/16/22 232 lb (105.2 kg)  07/14/22 230 lb 11.2 oz (104.6 kg)   Temp Readings from Last 3 Encounters:  10/13/22 99 F (37.2 C) (Temporal)  01/23/19 97.9 F (36.6 C) (Oral)  11/08/17 98.7 F (37.1 C) (Oral)   BP Readings from Last 3 Encounters:  10/13/22 (!) 140/110  09/16/22 (!) 147/96  07/14/22 (!) 149/95   Pulse Readings from Last 3 Encounters:  10/13/22 88  09/16/22 96  07/14/22 87       Hyperlipidemia: trying to lose weight but having trouble with this. Was not fasting when she got labs done with GYN 9/28. Not fasting today.   Prediabetic:  Lab Results  Component Value Date   HGBA1C  5.8 (H) 09/16/2022     Past Medical History:  Diagnosis Date   High cholesterol    Hyperlipemia    Increased risk of breast cancer    Pre-diabetes    Vitamin D deficiency     Past Surgical History:  Procedure Laterality Date   BREAST BIOPSY Left 2003   neg    Family History  Problem Relation Age of Onset   Hypertension Mother    Cancer Mother 29       brast   Heart disease Father 23       MI   Heart attack Father    Pancreatic cancer Maternal Grandmother    Mental illness Maternal Grandfather    Stroke Paternal Grandfather        79's   Breast cancer Other    Ovarian cancer Neg Hx    Colon cancer Neg Hx     Social History   Socioeconomic History   Marital status: Married    Spouse name: Not on file   Number of children: 3   Years of education: Not on file   Highest education level: Not on file  Occupational History   Occupation: Pharmacist, hospital    Comment: [employer] - elementary school in [place]  Tobacco Use   Smoking status: Never  Smokeless tobacco: Never  Vaping Use   Vaping Use: Never used  Substance and Sexual Activity   Alcohol use: No   Drug use: No   Sexual activity: Yes    Partners: Male    Birth control/protection: Other-see comments    Comment: husband-vasectomy  Other Topics Concern   Not on file  Social History Narrative   Not on file   Social Determinants of Health   Financial Resource Strain: Not on file  Food Insecurity: Not on file  Transportation Needs: Not on file  Physical Activity: Not on file  Stress: Not on file  Social Connections: Not on file  Intimate Partner Violence: Not on file    Outpatient Medications Prior to Visit  Medication Sig Dispense Refill   tranexamic acid (LYSTEDA) 650 MG TABS tablet Take 2 tablets (1,300 mg total) by mouth 3 (three) times daily. Take during menses for a maximum of five days 30 tablet 2   levothyroxine (SYNTHROID) 25 MCG tablet Take 1 tablet (25 mcg total) by mouth daily before breakfast. 90  tablet 3   norethindrone (AYGESTIN) 5 MG tablet Take 1 tablet (5 mg total) by mouth daily. Can increase to twice daily if bleeding persists. (Patient not taking: Reported on 09/16/2022) 90 tablet 2   nystatin-triamcinolone (MYCOLOG II) cream Apply 1 application topically 2 (two) times daily. (Patient not taking: Reported on 11/04/2021) 30 g 0   Vitamin D, Ergocalciferol, (DRISDOL) 1.25 MG (50000 UNIT) CAPS capsule Take 1 capsule (50,000 Units total) by mouth every 7 (seven) days. (Patient not taking: Reported on 09/16/2022) 8 capsule 0   No facility-administered medications prior to visit.    Allergies  Allergen Reactions   Aspirin Other (See Comments)    Nose bleed   Codeine Nausea Only   Crestor [Rosuvastatin] Other (See Comments)    myalgias   Penicillin G Rash   Sulfa Antibiotics Rash        Objective:    Physical Exam Vitals reviewed.  Constitutional:      General: She is not in acute distress.    Appearance: Normal appearance. She is obese. She is not ill-appearing or toxic-appearing.  HENT:     Right Ear: Tympanic membrane normal.     Left Ear: Tympanic membrane normal.     Mouth/Throat:     Mouth: Mucous membranes are moist.     Pharynx: No pharyngeal swelling.     Tonsils: No tonsillar exudate.  Eyes:     Extraocular Movements: Extraocular movements intact.     Conjunctiva/sclera: Conjunctivae normal.     Pupils: Pupils are equal, round, and reactive to light.  Neck:     Thyroid: No thyroid mass.  Cardiovascular:     Rate and Rhythm: Normal rate and regular rhythm.  Pulmonary:     Effort: Pulmonary effort is normal.     Breath sounds: Normal breath sounds.  Abdominal:     General: Abdomen is flat. Bowel sounds are normal.     Palpations: Abdomen is soft.  Musculoskeletal:        General: Normal range of motion.  Lymphadenopathy:     Cervical:     Right cervical: No superficial cervical adenopathy.    Left cervical: No superficial cervical adenopathy.   Skin:    General: Skin is warm.     Capillary Refill: Capillary refill takes less than 2 seconds.  Neurological:     General: No focal deficit present.     Mental Status: She is alert  and oriented to person, place, and time.  Psychiatric:        Mood and Affect: Mood normal.        Behavior: Behavior normal.        Thought Content: Thought content normal.        Judgment: Judgment normal.     BP (!) 140/110   Pulse 88   Temp 99 F (37.2 C) (Temporal)   Ht _0  (1.676 m)   Wt 233 lb (105.7 kg)   LMP 10/04/2022   SpO2 98%   BMI 37.61 kg/m  Wt Readings from Last 3 Encounters:  10/13/22 233 lb (105.7 kg)  09/16/22 232 lb (105.2 kg)  07/14/22 230 lb 11.2 oz (104.6 kg)     Health Maintenance Due  Topic Date Due   COLONOSCOPY (Pts 45-67yr Insurance coverage will need to be confirmed)  Never done    There are no preventive care reminders to display for this patient.  Lab Results  Component Value Date   TSH 7.280 (H) 09/16/2022   Lab Results  Component Value Date   WBC 8.9 09/16/2022   HGB 14.2 09/16/2022   HCT 41.2 09/16/2022   MCV 86 09/16/2022   PLT 316 09/16/2022   Lab Results  Component Value Date   NA 140 09/16/2022   K 4.2 09/16/2022   CO2 22 09/16/2022   GLUCOSE 79 09/16/2022   BUN 11 09/16/2022   CREATININE 0.79 09/16/2022   BILITOT 0.2 09/16/2022   ALKPHOS 71 09/16/2022   AST 57 (H) 09/16/2022   ALT 43 (H) 09/16/2022   PROT 7.4 09/16/2022   ALBUMIN 4.6 09/16/2022   CALCIUM 9.6 09/16/2022   EGFR 92 09/16/2022   Lab Results  Component Value Date   CHOL 249 (H) 09/16/2022   Lab Results  Component Value Date   HDL 49 09/16/2022   Lab Results  Component Value Date   LDLCALC 146 (H) 09/16/2022   Lab Results  Component Value Date   TRIG 295 (H) 09/16/2022   Lab Results  Component Value Date   CHOLHDL 5.1 (H) 09/16/2022   Lab Results  Component Value Date   HGBA1C 5.8 (H) 09/16/2022      Assessment & Plan:   Problem List  Items Addressed This Visit       Cardiovascular and Mediastinum   Primary hypertension    Start amlodipine 10 mg once daily Pt advised of the following:  Continue medication as prescribed. Monitor blood pressure periodically and/or when you feel symptomatic. Goal is <130/90 on average. Ensure that you have rested for 30 minutes prior to checking your blood pressure. Record your readings and bring them to your next visit if necessary.work on a low sodium diet.       Relevant Medications   amLODipine (NORVASC) 5 MG tablet     Endocrine   Hypothyroidism due to Hashimoto's thyroiditis    Increase levothyroxine to 50 mcg once daily Recheck tsh in four weeks        Relevant Medications   levothyroxine (SYNTHROID) 50 MCG tablet     Genitourinary   Abnormal uterine bleeding    Continue lysteda Continue f/u with gyn as scheduled        Other   Hyperlipidemia    Ordered lipid panel, pending results. Work on low cholesterol diet and exercise as tolerated       Relevant Medications   amLODipine (NORVASC) 5 MG tablet   Vitamin D deficiency - Primary  Start vitamin d3 2000 IU once daily      Prediabetes    Pt advised of the following: Work on a diabetic diet, try to incorporate exercise at least 20-30 a [place] for 3 days a week or more.        Relevant Orders   Amb Ref to Medical Weight Management   Obesity (BMI 35.0-39.9 without comorbidity)    Work on diet and exercise as tolerated       Relevant Orders   Amb Ref to Medical Weight Management   Other Visit Diagnoses     Screening for colon cancer       Relevant Orders   Ambulatory referral to Gastroenterology   At risk for diabetes mellitus       Relevant Orders   Amb Ref to Medical Weight Management       Meds ordered this encounter  Medications   amLODipine (NORVASC) 5 MG tablet    Sig: Take 1 tablet (5 mg total) by mouth daily.    Dispense:  90 tablet    Refill:  0    Order Specific Question:    Supervising Provider    Answer:   BEDSOLE, AMY E [[id number]]   levothyroxine (SYNTHROID) 50 MCG tablet    Sig: Take 1 tablet (50 mcg total) by mouth daily.    Dispense:  90 tablet    Refill:  3    Order Specific Question:   Supervising Provider    Answer:   Diona Browner, AMY E [[id number]]    Follow-up: Return in about 1 month (around 11/13/2022) for f/u blood pressure.    Eugenia Pancoast, FNP

## 2022-10-13 NOTE — Assessment & Plan Note (Signed)
Work on diet and exercise as tolerated  ?

## 2022-10-13 NOTE — Assessment & Plan Note (Signed)
Start amlodipine 10 mg once daily Pt advised of the following:  Continue medication as prescribed. Monitor blood pressure periodically and/or when you feel symptomatic. Goal is <130/90 on average. Ensure that you have rested for 30 minutes prior to checking your blood pressure. Record your readings and bring them to your next visit if necessary.work on a low sodium diet.

## 2022-10-13 NOTE — Assessment & Plan Note (Signed)
Continue lysteda Continue f/u with gyn as scheduled

## 2022-10-13 NOTE — Assessment & Plan Note (Signed)
Ordered lipid panel, pending results. Work on low cholesterol diet and exercise as tolerated ? ?

## 2022-10-13 NOTE — Assessment & Plan Note (Signed)
Increase levothyroxine to 50 mcg once daily Recheck tsh in four weeks

## 2022-10-13 NOTE — Assessment & Plan Note (Signed)
Start vitamin d3 2000 IU once daily

## 2022-10-13 NOTE — Patient Instructions (Addendum)
------------------------------------    Start amlodpine once daily.  Pt advised of the following:  Continue medication as prescribed. Monitor blood pressure periodically and/or when you feel symptomatic. Goal is <130/90 on average. Ensure that you have rested for 30 minutes prior to checking your blood pressure. Record your readings and bring them to your next visit if necessary.work on a low sodium diet.   ------------------------------------ A referral was placed today for colonoscopy with GI.  Please let us know if you have not heard back within 2 weeks about the referral.   ------------------------------------ Increase levothyroxine to 50 mcg once daily   ------------------------------------ Welcome to our clinic, I am happy to have you as my new patient. I am excited to continue on this healthcare journey with you.  Stop by the lab prior to leaving today. I will notify you of your results once received.   Please keep in mind Any my chart messages you send have up to a three business [place] turnaround for a response.  Phone calls may take up to a one full business [place] turnaround for a  response.   If you need a medication refill I recommend you request it through the pharmacy as this is easiest for [place] rather than sending a message and or phone call.   Due to recent changes in healthcare laws, you may see results of your imaging and/or laboratory studies on MyChart before I have had a chance to review them.  I understand that in some cases there may be results that are confusing or concerning to you. Please understand that not all results are received at the same time and often I may need to interpret multiple results in order to provide you with the best plan of care or course of treatment. Therefore, I ask that you please give me 2 business days to thoroughly review all your results before contacting my office for clarification. Should we see a critical lab result, you will be contacted  sooner.   It was a pleasure seeing you today! Please do not hesitate to reach out with any questions and or concerns.  Regards,   Eugenia Pancoast FNP-C

## 2022-10-15 ENCOUNTER — Encounter: Payer: Self-pay | Admitting: Family

## 2022-10-28 ENCOUNTER — Telehealth: Payer: BC Managed Care – PPO | Admitting: Family Medicine

## 2022-10-28 ENCOUNTER — Encounter: Payer: Self-pay | Admitting: Family

## 2022-10-28 DIAGNOSIS — E782 Mixed hyperlipidemia: Secondary | ICD-10-CM

## 2022-11-11 ENCOUNTER — Other Ambulatory Visit (INDEPENDENT_AMBULATORY_CARE_PROVIDER_SITE_OTHER): Payer: BC Managed Care – PPO

## 2022-11-11 DIAGNOSIS — E782 Mixed hyperlipidemia: Secondary | ICD-10-CM

## 2022-11-11 LAB — LIPID PANEL
Cholesterol: 207 mg/dL — ABNORMAL HIGH (ref 0–200)
HDL: 55.1 mg/dL (ref >39.00)
NonHDL: 151.82
Total CHOL/HDL Ratio: 4
Triglycerides: 209 mg/dL — ABNORMAL HIGH (ref 0.0–149.0)
VLDL: 41.8 mg/dL — ABNORMAL HIGH (ref 0.0–40.0)

## 2022-11-11 LAB — LDL CHOLESTEROL, DIRECT: Direct LDL: 134 mg/dL

## 2022-11-16 ENCOUNTER — Ambulatory Visit: Payer: BC Managed Care – PPO | Admitting: Family

## 2022-11-16 ENCOUNTER — Telehealth: Payer: Self-pay | Admitting: Family

## 2022-11-16 NOTE — Telephone Encounter (Signed)
Called patient to reschedule and as well she wanted to make sure if they did more than just cholestrol recheck. Call back number [phone].

## 2022-11-17 NOTE — Telephone Encounter (Signed)
Addressed via mychart

## 2022-11-18 MED ORDER — SIMVASTATIN 10 MG PO TABS: 10.0000 mg | ORAL_TABLET | Freq: Every day | ORAL | 3 refills | Status: DC

## 2022-11-18 NOTE — Addendum Note (Signed)
Addended by: Mort Sawyers on: 11/18/2022 09:53 AM   Modules accepted: Orders

## 2022-12-01 ENCOUNTER — Ambulatory Visit: Payer: BC Managed Care – PPO | Admitting: Family

## 2022-12-22 ENCOUNTER — Encounter: Payer: Self-pay | Admitting: Family

## 2022-12-22 ENCOUNTER — Ambulatory Visit: Payer: BC Managed Care – PPO | Admitting: Family

## 2022-12-22 VITALS — BP 140/110 | HR 98 | Temp 97.9°F | Resp 16 | Ht 66.0 in | Wt 233.0 lb

## 2022-12-22 DIAGNOSIS — E038 Other specified hypothyroidism: Secondary | ICD-10-CM | POA: Diagnosis not present

## 2022-12-22 DIAGNOSIS — E063 Autoimmune thyroiditis: Secondary | ICD-10-CM

## 2022-12-22 DIAGNOSIS — E782 Mixed hyperlipidemia: Secondary | ICD-10-CM | POA: Diagnosis not present

## 2022-12-22 DIAGNOSIS — F411 Generalized anxiety disorder: Secondary | ICD-10-CM

## 2022-12-22 DIAGNOSIS — I10 Essential (primary) hypertension: Secondary | ICD-10-CM | POA: Diagnosis not present

## 2022-12-22 MED ORDER — SERTRALINE HCL 50 MG PO TABS: 50.0000 mg | ORAL_TABLET | Freq: Every day | ORAL | 3 refills | Status: DC

## 2022-12-22 MED ORDER — HYDROXYZINE PAMOATE 50 MG PO CAPS: 50.0000 mg | ORAL_CAPSULE | Freq: Three times a day (TID) | ORAL | 0 refills | Status: DC | PRN

## 2022-12-22 MED ORDER — LOSARTAN POTASSIUM 50 MG PO TABS: 50.0000 mg | ORAL_TABLET | Freq: Every day | ORAL | 1 refills | Status: DC

## 2022-12-22 NOTE — Assessment & Plan Note (Signed)
Will check lipid panel next visit.

## 2022-12-22 NOTE — Patient Instructions (Addendum)
  Stop by the lab prior to leaving today. I will notify you of your results once received.   Start losartan 50 mg once daily.   Start sertraline (Zoloft) 50 mg for anxiety and depression. Take 1/2 tablet by mouth once daily for about one week, then increase to 1 full tablet thereafter.   Can use hydroxyzine as needed for heightened anxiety.   Taking the medicine as directed and not missing any doses is one of the best things you can do to treat your anxiety.  Here are some things to keep in mind:  Side effects (stomach upset, some increased anxiety) may happen before you notice a benefit.  These side effects typically go away over time. Changes to your dose of medicine or a change in medication all together is sometimes necessary Many people will notice an improvement within two weeks but the full effect of the medication can take up to 4-6 weeks Stopping the medication when you start feeling better often results in a return of symptoms. Most people need to be on medication at least 6-12 months If you start having thoughts of hurting yourself or others after starting this medicine, please call me immediately.     Regards,   Eugenia Pancoast FNP-C

## 2022-12-22 NOTE — Assessment & Plan Note (Signed)
Check tsh pending results.  Continue levothyroxine 75 mcg once daily.

## 2022-12-22 NOTE — Assessment & Plan Note (Signed)
Continue amlodipine 10 mg  Start losartan 50 mg once daily  Pt advised of the following:  Continue medication as prescribed. Monitor blood pressure periodically and/or when you feel symptomatic. Goal is <130/90 on average. Ensure that you have rested for 30 minutes prior to checking your blood pressure. Record your readings and bring them to your next visit if necessary.work on a low sodium diet.

## 2022-12-22 NOTE — Progress Notes (Signed)
Established Patient Office Visit  Subjective:  Patient ID: Katherine Callahan, female    DOB: 1976/08/15  Age: 47 y.o. MRN: [id number]  CC:  Chief Complaint  Patient presents with   Hypertension    HPI Katherine Callahan is here today for follow up.   Here for f/u blood pressure.  Htn: has not been taking amlodipine 10 mg once daily over the last one week however since 10/24 has been taking daily.    Past Medical History:  Diagnosis Date   High cholesterol    Hyperlipemia    Increased risk of breast cancer    Pre-diabetes    Vitamin D deficiency     Past Surgical History:  Procedure Laterality Date   BREAST BIOPSY Left 2003   neg    Family History  Problem Relation Age of Onset   Hypertension Mother    Cancer Mother 74       brast   Heart disease Father 84       MI   Heart attack Father    Pancreatic cancer Maternal Grandmother    Mental illness Maternal Grandfather    Stroke Paternal Grandfather        60's   Breast cancer Other    Ovarian cancer Neg Hx    Colon cancer Neg Hx     Social History   Socioeconomic History   Marital status: Married    Spouse name: Not on file   Number of children: 3   Years of education: Not on file   Highest education level: Not on file  Occupational History   Occupation: Pharmacist, hospital    Comment: [employer] - elementary school in [place]  Tobacco Use   Smoking status: Never   Smokeless tobacco: Never  Vaping Use   Vaping Use: Never used  Substance and Sexual Activity   Alcohol use: No   Drug use: No   Sexual activity: Yes    Partners: Male    Birth control/protection: Other-see comments    Comment: husband-vasectomy  Other Topics Concern   Not on file  Social History Narrative   Not on file   Social Determinants of Health   Financial Resource Strain: Not on file  Food Insecurity: Not on file  Transportation Needs: Not on file  Physical Activity: Not on file  Stress: Not on file  Social Connections: Not on file  Intimate  Partner Violence: Not on file    Outpatient Medications Prior to Visit  Medication Sig Dispense Refill   amLODipine (NORVASC) 5 MG tablet Take 1 tablet (5 mg total) by mouth daily. 90 tablet 0   levothyroxine (SYNTHROID) 50 MCG tablet Take 1 tablet (50 mcg total) by mouth daily. 90 tablet 3   simvastatin (ZOCOR) 10 MG tablet Take 1 tablet (10 mg total) by mouth at bedtime. 90 tablet 3   tranexamic acid (LYSTEDA) 650 MG TABS tablet Take 2 tablets (1,300 mg total) by mouth 3 (three) times daily. Take during menses for a maximum of five days 30 tablet 2   No facility-administered medications prior to visit.    Allergies  Allergen Reactions   Aspirin Other (See Comments)    Nose bleed   Codeine Nausea Only   Crestor [Rosuvastatin] Other (See Comments)    myalgias   Penicillin G Rash   Sulfa Antibiotics Rash          Objective:    Physical Exam Vitals reviewed.  Constitutional:      General: She  is not in acute distress.    Appearance: Normal appearance. She is obese. She is not ill-appearing, toxic-appearing or diaphoretic.  Cardiovascular:     Rate and Rhythm: Normal rate and regular rhythm.  Pulmonary:     Effort: Pulmonary effort is normal.  Neurological:     General: No focal deficit present.     Mental Status: She is alert and oriented to person, place, and time. Mental status is at baseline.  Psychiatric:        Mood and Affect: Mood normal.        Behavior: Behavior normal.        Thought Content: Thought content normal.        Judgment: Judgment normal.      BP (!) 140/110   Pulse 98   Temp 97.9 F (36.6 C)   Resp 16   Ht 5' 6" (1.676 m)   Wt 233 lb (105.7 kg)   LMP 11/24/2022 (Approximate)   SpO2 98%   BMI 37.61 kg/m  Wt Readings from Last 3 Encounters:  12/22/22 233 lb (105.7 kg)  10/13/22 233 lb (105.7 kg)  09/16/22 232 lb (105.2 kg)     Health Maintenance Due  Topic Date Due   DTaP/Tdap/Td (1 - Tdap) Never done   COLONOSCOPY (Pts  45-52yr Insurance coverage will need to be confirmed)  Never done   COVID-19 Vaccine (2 - 2023-24 season) 08/21/2022    There are no preventive care reminders to display for this patient.  Lab Results  Component Value Date   TSH 7.280 (H) 09/16/2022   Lab Results  Component Value Date   WBC 8.9 09/16/2022   HGB 14.2 09/16/2022   HCT 41.2 09/16/2022   MCV 86 09/16/2022   PLT 316 09/16/2022   Lab Results  Component Value Date   NA 140 09/16/2022   K 4.2 09/16/2022   CO2 22 09/16/2022   GLUCOSE 79 09/16/2022   BUN 11 09/16/2022   CREATININE 0.79 09/16/2022   BILITOT 0.2 09/16/2022   ALKPHOS 71 09/16/2022   AST 57 (H) 09/16/2022   ALT 43 (H) 09/16/2022   PROT 7.4 09/16/2022   ALBUMIN 4.6 09/16/2022   CALCIUM 9.6 09/16/2022   EGFR 92 09/16/2022   Lab Results  Component Value Date   CHOL 207 (H) 11/11/2022   Lab Results  Component Value Date   HDL 55.10 11/11/2022   Lab Results  Component Value Date   LDLCALC 146 (H) 09/16/2022   Lab Results  Component Value Date   TRIG 209.0 (H) 11/11/2022   Lab Results  Component Value Date   CHOLHDL 4 11/11/2022   Lab Results  Component Value Date   HGBA1C 5.8 (H) 09/16/2022      Assessment & Plan:   Problem List Items Addressed This Visit       Cardiovascular and Mediastinum   Primary hypertension - Primary    Continue amlodipine 10 mg  Start losartan 50 mg once daily  Pt advised of the following:  Continue medication as prescribed. Monitor blood pressure periodically and/or when you feel symptomatic. Goal is <130/90 on average. Ensure that you have rested for 30 minutes prior to checking your blood pressure. Record your readings and bring them to your next visit if necessary.work on a low sodium diet.       Relevant Medications   losartan (COZAAR) 50 MG tablet     Endocrine   Hypothyroidism due to Hashimoto's thyroiditis    Check tsh pending  results.  Continue levothyroxine 75 mcg once daily.       Relevant Orders   TSH   T4, free     Other   Hyperlipidemia    Will check lipid panel next visit.      Relevant Medications   losartan (COZAAR) 50 MG tablet   Anxiety state    I instructed pt to start zoloft 1/2 tablet once daily for 1 week and then increase to a full tablet once daily on week two as tolerated.  We discussed common side effects such as nausea, drowsiness and weight gain.  Also discussed rare but serious side effect of suicidal ideation.  She is instructed to discontinue medication and go directly to ED if this occurs.  Pt verbalizes understanding.  Plan is to follow up in 30 days to evaluate progress.         Relevant Medications   sertraline (ZOLOFT) 50 MG tablet   hydrOXYzine (VISTARIL) 50 MG capsule    Meds ordered this encounter  Medications   losartan (COZAAR) 50 MG tablet    Sig: Take 1 tablet (50 mg total) by mouth daily.    Dispense:  90 tablet    Refill:  1    Order Specific Question:   Supervising Provider    Answer:   BEDSOLE, AMY E [[id number]]   sertraline (ZOLOFT) 50 MG tablet    Sig: Take 1 tablet (50 mg total) by mouth daily.    Dispense:  30 tablet    Refill:  3    Order Specific Question:   Supervising Provider    Answer:   BEDSOLE, AMY E [[id number]]   hydrOXYzine (VISTARIL) 50 MG capsule    Sig: Take 1 capsule (50 mg total) by mouth 3 (three) times daily as needed.    Dispense:  30 capsule    Refill:  0    Order Specific Question:   Supervising Provider    Answer:   BEDSOLE, AMY E [[id number]]    Follow-up: Return in about 3 months (around 03/23/2023) for f/u blood pressure.    Eugenia Pancoast, FNP

## 2022-12-22 NOTE — Assessment & Plan Note (Signed)
I instructed pt to start zoloft 1/2 tablet once daily for 1 week and then increase to a full tablet once daily on week two as tolerated.  We discussed common side effects such as nausea, drowsiness and weight gain.  Also discussed rare but serious side effect of suicidal ideation.  She is instructed to discontinue medication and go directly to ED if this occurs.  Pt verbalizes understanding.  Plan is to follow up in 30 days to evaluate progress.    

## 2022-12-23 ENCOUNTER — Encounter: Payer: Self-pay | Admitting: Family

## 2022-12-23 ENCOUNTER — Other Ambulatory Visit: Payer: Self-pay | Admitting: Family

## 2022-12-23 DIAGNOSIS — E038 Other specified hypothyroidism: Secondary | ICD-10-CM

## 2022-12-23 LAB — TSH: TSH: 4.95 u[IU]/mL (ref 0.35–5.50)

## 2022-12-23 LAB — T4, FREE: Free T4: 0.72 ng/dL (ref 0.60–1.60)

## 2022-12-23 MED ORDER — LEVOTHYROXINE SODIUM 75 MCG PO TABS: 75.0000 ug | ORAL_TABLET | Freq: Every day | ORAL | 1 refills | Status: DC

## 2022-12-31 ENCOUNTER — Ambulatory Visit (INDEPENDENT_AMBULATORY_CARE_PROVIDER_SITE_OTHER): Payer: BC Managed Care – PPO | Admitting: Family Medicine

## 2022-12-31 ENCOUNTER — Encounter (INDEPENDENT_AMBULATORY_CARE_PROVIDER_SITE_OTHER): Payer: Self-pay | Admitting: Family Medicine

## 2022-12-31 VITALS — BP 140/89 | HR 104 | Temp 98.4°F | Ht 65.0 in | Wt 227.0 lb

## 2022-12-31 DIAGNOSIS — E669 Obesity, unspecified: Secondary | ICD-10-CM | POA: Diagnosis not present

## 2022-12-31 DIAGNOSIS — I10 Essential (primary) hypertension: Secondary | ICD-10-CM | POA: Diagnosis not present

## 2022-12-31 DIAGNOSIS — E038 Other specified hypothyroidism: Secondary | ICD-10-CM | POA: Insufficient documentation

## 2022-12-31 DIAGNOSIS — Z6837 Body mass index (BMI) 37.0-37.9, adult: Secondary | ICD-10-CM

## 2022-12-31 DIAGNOSIS — R7303 Prediabetes: Secondary | ICD-10-CM | POA: Diagnosis not present

## 2023-01-05 ENCOUNTER — Other Ambulatory Visit: Payer: Self-pay | Admitting: Family

## 2023-01-05 DIAGNOSIS — E038 Other specified hypothyroidism: Secondary | ICD-10-CM

## 2023-01-12 NOTE — Telephone Encounter (Signed)
Noted  

## 2023-01-13 ENCOUNTER — Other Ambulatory Visit: Payer: Self-pay | Admitting: Family

## 2023-01-13 DIAGNOSIS — F411 Generalized anxiety disorder: Secondary | ICD-10-CM

## 2023-01-22 ENCOUNTER — Telehealth: Payer: Self-pay | Admitting: Family

## 2023-01-22 DIAGNOSIS — B3731 Acute candidiasis of vulva and vagina: Secondary | ICD-10-CM

## 2023-01-22 NOTE — Telephone Encounter (Signed)
-----   Message from Eugenia Pancoast, [place] sent at 12/22/2022  2:40 PM EST ----- Ask how pt is doing with blood pressure meds as well as anxiety meds

## 2023-01-26 NOTE — Progress Notes (Signed)
Office: [phone]  /  Fax: [phone]   Initial Visit  Katherine Callahan was seen in clinic today to evaluate for obesity. She is interested in losing weight to improve overall health and reduce the risk of weight related complications. She presents today to review program treatment options, initial physical assessment, and evaluation.   Patient lives with husband and children ages 38, 30, 49 years old.  She was referred by: PCP  When asked what else they would like to accomplish? She states: Improve existing medical conditions, Improve appearance, and Lose a target amount of weight : 160 lbs , she is slowly gained weight in the past 2 years.  When asked how has your weight affected you? She states: Contributed to medical problems, Contributed to orthopedic problems or mobility issues, and Having fatigue  Some associated conditions: Hypertension and Hyperlipidemia  Contributing factors: Family history and Stress  Weight promoting medications identified: Other: n/a  Current nutrition plan: None  Current level of physical activity: None and Step counting  Current or previous pharmacotherapy: Phentermine  Response to medication: Had side effects so it was discontinued   Past medical history includes:   Past Medical History:  Diagnosis Date   High cholesterol    Hyperlipemia    Increased risk of breast cancer    Pre-diabetes    Vitamin D deficiency    Objective:   BP (!) 140/89   Pulse (!) 104   Temp 98.4 F (36.9 C)   Ht 5\' 5"  (1.651 m)   Wt 227 lb (103 kg)   LMP 11/24/2022 (Approximate)   SpO2 97%   BMI 37.77 kg/m  She was weighed on the bioimpedance scale: Body mass index is 37.77 kg/m.  Peak Weight:227 lbs ,Visceral Fat Rating:13, Body Fat%:46.0  General:  Alert, oriented and cooperative. Patient is in no acute distress.  Respiratory: Normal respiratory effort, no problems with respiration noted  Extremities: Normal range of motion.    Mental Status: Normal  mood and affect. Normal behavior. Normal judgment and thought content.   Assessment and Plan:  1. Other specified hypothyroidism Patient started levothyroxine 75 mcg daily, was previously not taking consistently and TSH was elevated.  Dose was increased recently and she has more energy and has lost 5 LBS.  Continue levothyroxine 75 mcg daily as directed.  2. Essential hypertension Positive family history of hypertension.  Patient started recently on amlodipine 5 mg daily.  Systolic blood pressure 263 today.  Look for improvement in hypertension with weight reduction.  Aim for> 5% TBW reduction.  3. Pre-diabetes 09/16/2022, A1c was 5.8.  Patient is concerned about developing diabetes.  Patient does not exercise on a regular basis.  Begin reducing high sugar items and track daily steps.  4. Obesity,current BMI 37.9 1.  Reviewed bioimpedance results. 2.  Reviewed information about our program. 3.  Begin tracking daily steps with smart watch.  We reviewed weight, biometrics, associated medical conditions and contributing factors with patient. She would benefit from weight loss therapy via a modified calorie, low-carb, high-protein nutritional plan tailored to their REE (resting energy expenditure) which will be determined by indirect calorimetry.  We will also assess for cardiometabolic risk and nutritional derangements via fasting serologies at her next appointment.      Obesity Treatment / Action Plan:  Patient will work on garnering support from family and friends to begin weight loss journey. Will work on eliminating or reducing the presence of highly palatable, calorie dense foods in the home. Will  complete provided nutritional and psychosocial assessment questionnaire before the next appointment. Will be scheduled for indirect calorimetry to determine resting energy expenditure in a fasting state.  This will allow [place] to create a reduced calorie, high-protein meal plan to promote loss  of fat mass while preserving muscle mass. Will think about ideas on how to incorporate physical activity into their daily routine. Was counseled on nutritional approaches to weight loss and benefits of complex carbs and high quality protein as part of nutritional weight management.  Obesity Education Performed Today:  She was weighed on the bioimpedance scale and results were discussed and documented in the synopsis.  We discussed obesity as a disease and the importance of a more detailed evaluation of all the factors contributing to the disease.  We discussed the importance of long term lifestyle changes which include nutrition, exercise and behavioral modifications as well as the importance of customizing this to her specific health and social needs.  We discussed the benefits of reaching a healthier weight to alleviate the symptoms of existing conditions and reduce the risks of the biomechanical, metabolic and psychological effects of obesity.  Katherine Callahan appears to be in the action stage of change and states they are ready to start intensive lifestyle modifications and behavioral modifications.  30 minutes was spent today on this visit including the above counseling, pre-visit chart review, and post-visit documentation.  Reviewed by clinician on [place] of visit: allergies, medications, problem list, medical history, surgical history, family history, social history, and previous encounter notes.  I, Davy Pique, am acting as Location manager for Loyal Gambler, DO.  I have reviewed the above documentation for accuracy and completeness, and I agree with the above. Dell Ponto, DO

## 2023-02-04 MED ORDER — FLUCONAZOLE 150 MG PO TABS: ORAL_TABLET | ORAL | 0 refills | Status: DC

## 2023-02-04 NOTE — Addendum Note (Signed)
Addended by: Eugenia Pancoast on: 02/04/2023 01:47 PM   Modules accepted: Orders

## 2023-02-05 ENCOUNTER — Ambulatory Visit: Payer: BC Managed Care – PPO | Admitting: Podiatry

## 2023-02-05 ENCOUNTER — Encounter: Payer: Self-pay | Admitting: Podiatry

## 2023-02-05 DIAGNOSIS — B070 Plantar wart: Secondary | ICD-10-CM

## 2023-02-05 DIAGNOSIS — Q828 Other specified congenital malformations of skin: Secondary | ICD-10-CM

## 2023-02-05 NOTE — Progress Notes (Signed)
Subjective:  Patient ID: Katherine Callahan, female    DOB: [birth year],  MRN: [id number]  Chief Complaint  Patient presents with   Foot Pain    "There's a place on my right foot that really hurts. It's gotten worse the past three weeks."    47 y.o. female presents with the above complaint.  Patient presents with right submetatarsal 4 porokeratotic lesion painful to touch is progressive gotten worse hurts with ambulation worse with pressure.  Skin worse over the last 3 weeks.  She wanted discuss treatment options she is prediabetic.  No other acute issues.   Review of Systems: Negative except as noted in the HPI. Denies N/V/F/Ch.  Past Medical History:  Diagnosis Date   High cholesterol    Hyperlipemia    Increased risk of breast cancer    Pre-diabetes    Vitamin D deficiency     Current Outpatient Medications:    amLODipine (NORVASC) 5 MG tablet, Take 1 tablet (5 mg total) by mouth daily., Disp: 90 tablet, Rfl: 0   fluconazole (DIFLUCAN) 150 MG tablet, Take one po qd for one dose, repeat in three days if still with symptoms., Disp: 2 tablet, Rfl: 0   levothyroxine (SYNTHROID) 75 MCG tablet, TAKE 1 TABLET BY MOUTH EVERY [place], Disp: 90 tablet, Rfl: 1   sertraline (ZOLOFT) 50 MG tablet, TAKE 1 TABLET BY MOUTH EVERY [place], Disp: 90 tablet, Rfl: 2   simvastatin (ZOCOR) 10 MG tablet, Take 1 tablet (10 mg total) by mouth at bedtime., Disp: 90 tablet, Rfl: 3   tranexamic acid (LYSTEDA) 650 MG TABS tablet, Take 2 tablets (1,300 mg total) by mouth 3 (three) times daily. Take during menses for a maximum of five days, Disp: 30 tablet, Rfl: 2   hydrOXYzine (VISTARIL) 50 MG capsule, Take 1 capsule (50 mg total) by mouth 3 (three) times daily as needed. (Patient not taking: Reported on 02/05/2023), Disp: 30 capsule, Rfl: 0   losartan (COZAAR) 50 MG tablet, Take 1 tablet (50 mg total) by mouth daily. (Patient not taking: Reported on 02/05/2023), Disp: 90 tablet, Rfl: 1  Social History   Tobacco Use  Smoking  Status Never  Smokeless Tobacco Never    Allergies  Allergen Reactions   Aspirin Other (See Comments)    Nose bleed   Codeine Nausea Only   Crestor [Rosuvastatin] Other (See Comments)    myalgias   Penicillin G Rash   Sulfa Antibiotics Rash   Objective:  There were no vitals filed for this visit. There is no height or weight on file to calculate BMI. Constitutional Well developed. Well nourished.  Vascular Dorsalis pedis pulses palpable bilaterally. Posterior tibial pulses palpable bilaterally. Capillary refill normal to all digits.  No cyanosis or clubbing noted. Pedal hair growth normal.  Neurologic Normal speech. Oriented to person, place, and time. Epicritic sensation to light touch grossly present bilaterally.  Dermatologic Right submetatarsal 4 porokeratotic lesion painful to touch.  Some pinpoint bleeding noted no other abnormalities identified  Orthopedic: Normal joint ROM without pain or crepitus bilaterally. No visible deformities. No bony tenderness.   Radiographs: None Assessment:   1. Plantar verruca   2. Porokeratosis    Plan:  Patient was evaluated and treated and all questions answered.  Right submetatarsal 4 porokeratosis/plantar verruca -All questions and concerns were discussed with the patient in extensive detail.  Given the amount of pain that she is experiencing she will benefit from [facility] therapy --Lesion was debrided today without complications. Hemostasis was achieved and  the area was cleaned. Cantharone was applied followed by an occlusive bandage. Post procedure complications were discussed. Monitor for signs or symptoms of infection and directed to call the office mainly should any occur.   No follow-ups on file.

## 2023-02-19 ENCOUNTER — Ambulatory Visit: Payer: BC Managed Care – PPO | Admitting: Podiatry

## 2023-02-19 DIAGNOSIS — B070 Plantar wart: Secondary | ICD-10-CM | POA: Diagnosis not present

## 2023-02-19 NOTE — Progress Notes (Signed)
Subjective:  Patient ID: Katherine Callahan, female    DOB: December 09, 1976,  MRN: [id number]  Chief Complaint  Patient presents with   Plantar Warts    47 y.o. female presents with the above complaint.  Patient presents for follow-up of right submetatarsal 4 benign skin lesion/porokeratotic lesion/plantar verruca.  Patient states that she is doing okay.  The blister was painful.  She is here for another application of Cantharone   Review of Systems: Negative except as noted in the HPI. Denies N/V/F/Ch.  Past Medical History:  Diagnosis Date   High cholesterol    Hyperlipemia    Increased risk of breast cancer    Pre-diabetes    Vitamin D deficiency     Current Outpatient Medications:    amLODipine (NORVASC) 5 MG tablet, Take 1 tablet (5 mg total) by mouth daily., Disp: 90 tablet, Rfl: 0   fluconazole (DIFLUCAN) 150 MG tablet, Take one po qd for one dose, repeat in three days if still with symptoms., Disp: 2 tablet, Rfl: 0   hydrOXYzine (VISTARIL) 50 MG capsule, Take 1 capsule (50 mg total) by mouth 3 (three) times daily as needed. (Patient not taking: Reported on 02/05/2023), Disp: 30 capsule, Rfl: 0   levothyroxine (SYNTHROID) 75 MCG tablet, TAKE 1 TABLET BY MOUTH EVERY [place], Disp: 90 tablet, Rfl: 1   losartan (COZAAR) 50 MG tablet, Take 1 tablet (50 mg total) by mouth daily. (Patient not taking: Reported on 02/05/2023), Disp: 90 tablet, Rfl: 1   sertraline (ZOLOFT) 50 MG tablet, TAKE 1 TABLET BY MOUTH EVERY [place], Disp: 90 tablet, Rfl: 2   simvastatin (ZOCOR) 10 MG tablet, Take 1 tablet (10 mg total) by mouth at bedtime., Disp: 90 tablet, Rfl: 3   tranexamic acid (LYSTEDA) 650 MG TABS tablet, Take 2 tablets (1,300 mg total) by mouth 3 (three) times daily. Take during menses for a maximum of five days, Disp: 30 tablet, Rfl: 2  Social History   Tobacco Use  Smoking Status Never  Smokeless Tobacco Never    Allergies  Allergen Reactions   Aspirin Other (See Comments)    Nose bleed   Codeine  Nausea Only   Crestor [Rosuvastatin] Other (See Comments)    myalgias   Penicillin G Rash   Sulfa Antibiotics Rash   Objective:  There were no vitals filed for this visit. There is no height or weight on file to calculate BMI. Constitutional Well developed. Well nourished.  Vascular Dorsalis pedis pulses palpable bilaterally. Posterior tibial pulses palpable bilaterally. Capillary refill normal to all digits.  No cyanosis or clubbing noted. Pedal hair growth normal.  Neurologic Normal speech. Oriented to person, place, and time. Epicritic sensation to light touch grossly present bilaterally.  Dermatologic Right submetatarsal 4 porokeratotic lesion painful to touch slowly improving.  Some pinpoint bleeding noted no other abnormalities identified  Orthopedic: Normal joint ROM without pain or crepitus bilaterally. No visible deformities. No bony tenderness.   Radiographs: None Assessment:   No diagnosis found.  Plan:  Patient was evaluated and treated and all questions answered.  Right submetatarsal 4 porokeratosis/plantar verruca~second application -All questions and concerns were discussed with the patient in extensive detail.  Given the amount of pain that she is experiencing she will benefit from [facility] therapy --Lesion was debrided today without complications. Hemostasis was achieved and the area was cleaned. Cantharone was applied followed by an occlusive bandage. Post procedure complications were discussed. Monitor for signs or symptoms of infection and directed to call the office mainly  should any occur.   No follow-ups on file.

## 2023-03-03 MED ORDER — AMLODIPINE BESYLATE 10 MG PO TABS: 10.0000 mg | ORAL_TABLET | Freq: Every day | ORAL | 0 refills | Status: DC

## 2023-03-03 NOTE — Addendum Note (Signed)
Addended by: Eugenia Pancoast on: 03/03/2023 08:07 AM   Modules accepted: Orders

## 2023-03-05 ENCOUNTER — Ambulatory Visit: Payer: BC Managed Care – PPO | Admitting: Podiatry

## 2023-03-05 DIAGNOSIS — B070 Plantar wart: Secondary | ICD-10-CM | POA: Diagnosis not present

## 2023-03-05 NOTE — Progress Notes (Signed)
  Subjective:  Patient ID: Katherine Callahan, female    DOB: [birth year],  MRN: [id number]  No chief complaint on file.   47 y.o. female presents with the above complaint.  Patient presents for follow-up of right submetatarsal 4 benign skin lesion/porokeratotic lesion/plantar verruca.  She states she is doing better.  Is not as painful denies any other acute complaints   Review of Systems: Negative except as noted in the HPI. Denies N/V/F/Ch.  Past Medical History:  Diagnosis Date   High cholesterol    Hyperlipemia    Increased risk of breast cancer    Pre-diabetes    Vitamin D deficiency     Current Outpatient Medications:    amLODipine (NORVASC) 10 MG tablet, Take 1 tablet (10 mg total) by mouth daily., Disp: 90 tablet, Rfl: 0   fluconazole (DIFLUCAN) 150 MG tablet, Take one po qd for one dose, repeat in three days if still with symptoms., Disp: 2 tablet, Rfl: 0   hydrOXYzine (VISTARIL) 50 MG capsule, Take 1 capsule (50 mg total) by mouth 3 (three) times daily as needed. (Patient not taking: Reported on 02/05/2023), Disp: 30 capsule, Rfl: 0   levothyroxine (SYNTHROID) 75 MCG tablet, TAKE 1 TABLET BY MOUTH EVERY [place], Disp: 90 tablet, Rfl: 1   losartan (COZAAR) 50 MG tablet, Take 1 tablet (50 mg total) by mouth daily. (Patient not taking: Reported on 02/05/2023), Disp: 90 tablet, Rfl: 1   sertraline (ZOLOFT) 50 MG tablet, TAKE 1 TABLET BY MOUTH EVERY [place], Disp: 90 tablet, Rfl: 2   simvastatin (ZOCOR) 10 MG tablet, Take 1 tablet (10 mg total) by mouth at bedtime., Disp: 90 tablet, Rfl: 3   tranexamic acid (LYSTEDA) 650 MG TABS tablet, Take 2 tablets (1,300 mg total) by mouth 3 (three) times daily. Take during menses for a maximum of five days, Disp: 30 tablet, Rfl: 2  Social History   Tobacco Use  Smoking Status Never  Smokeless Tobacco Never    Allergies  Allergen Reactions   Aspirin Other (See Comments)    Nose bleed   Codeine Nausea Only   Crestor [Rosuvastatin] Other (See Comments)     myalgias   Penicillin G Rash   Sulfa Antibiotics Rash   Objective:  There were no vitals filed for this visit. There is no height or weight on file to calculate BMI. Constitutional Well developed. Well nourished.  Vascular Dorsalis pedis pulses palpable bilaterally. Posterior tibial pulses palpable bilaterally. Capillary refill normal to all digits.  No cyanosis or clubbing noted. Pedal hair growth normal.  Neurologic Normal speech. Oriented to person, place, and time. Epicritic sensation to light touch grossly present bilaterally.  Dermatologic Clinically improved.  No further lesion noted.  No signs of recurrence noted.  Orthopedic: Normal joint ROM without pain or crepitus bilaterally. No visible deformities. No bony tenderness.   Radiographs: None Assessment:   No diagnosis found.  Plan:  Patient was evaluated and treated and all questions answered.  Right submetatarsal 4 porokeratosis/plantar verruca~second application -Clinically healed.  No further porokeratotic lesion plantar verruca noted.  At this time discussed prevention technique if any foot and ankle issues on future she will come back and see me.   No follow-ups on file.

## 2023-04-08 ENCOUNTER — Encounter: Payer: Self-pay | Admitting: Family Medicine

## 2023-04-09 ENCOUNTER — Encounter: Payer: Self-pay | Admitting: Family

## 2023-04-09 DIAGNOSIS — I10 Essential (primary) hypertension: Secondary | ICD-10-CM

## 2023-04-09 MED ORDER — AMLODIPINE BESYLATE 5 MG PO TABS: 5.0000 mg | ORAL_TABLET | Freq: Every day | ORAL | 3 refills | Status: DC

## 2023-04-12 ENCOUNTER — Encounter: Payer: Self-pay | Admitting: Family Medicine

## 2023-04-12 ENCOUNTER — Ambulatory Visit: Payer: BC Managed Care – PPO | Admitting: Family Medicine

## 2023-04-12 VITALS — BP 122/81 | HR 90 | Wt 231.0 lb

## 2023-04-12 DIAGNOSIS — N939 Abnormal uterine and vaginal bleeding, unspecified: Secondary | ICD-10-CM | POA: Diagnosis not present

## 2023-04-12 NOTE — Progress Notes (Signed)
   Subjective:    Patient ID: Katherine Callahan is a 47 y.o. female presenting with Consult  on 04/12/2023  HPI: Having some pain, and bleeding. She is having more pain with her cycle. She has adenomyosis. H/o SVD x 3. Has had success with Lysteda.  Review of Systems  Constitutional:  Negative for chills and fever.  Respiratory:  Negative for shortness of breath.   Cardiovascular:  Negative for chest pain.  Gastrointestinal:  Negative for abdominal pain, nausea and vomiting.  Genitourinary:  Negative for dysuria.  Skin:  Negative for rash.      Objective:    BP 122/81   Pulse 90   Wt 231 lb (104.8 kg)   LMP 04/03/2023   BMI 38.44 kg/m  Physical Exam Exam conducted with a chaperone present.  Constitutional:      General: She is not in acute distress.    Appearance: She is well-developed.  HENT:     Head: Normocephalic and atraumatic.  Eyes:     General: No scleral icterus. Cardiovascular:     Rate and Rhythm: Normal rate.  Pulmonary:     Effort: Pulmonary effort is normal.  Abdominal:     Palpations: Abdomen is soft.  Musculoskeletal:     Cervical back: Neck supple.  Skin:    General: Skin is warm and dry.  Neurological:     Mental Status: She is alert and oriented to person, place, and time.         Assessment & Plan:   Problem List Items Addressed This Visit       Unprioritized   Abnormal uterine bleeding - Primary    Would consider D & c with endometrial sampling + hysteroscopy and HTA. Husband has had vasectomy and much less risky than hysterectomy. She declined this, but will discuss with husband. If not doing this, alternative is sonohysterogram and EMB.      Relevant Orders   [place] Sonohysterogram    Return in about 4 weeks (around 05/10/2023) for needs U/S, endometrial sampling.  Reva Bores, MD 04/12/2023 1:19 PM

## 2023-04-12 NOTE — Assessment & Plan Note (Signed)
Would consider D & c with endometrial sampling + hysteroscopy and HTA. Husband has had vasectomy and much less risky than hysterectomy. She declined this, but will discuss with husband. If not doing this, alternative is sonohysterogram and EMB.

## 2023-04-12 NOTE — Progress Notes (Signed)
Patient here to discuss Hysterectomy.   Last pap: 09/16/22 WNL   LMP:04/03/2023   CC: Pain with periods and notes heavy cycles as well takes Lysteda PRN last took in March.

## 2023-04-22 ENCOUNTER — Ambulatory Visit: Payer: BC Managed Care – PPO | Admitting: Family

## 2023-04-22 ENCOUNTER — Encounter: Payer: Self-pay | Admitting: Family

## 2023-04-22 VITALS — BP 124/72 | HR 94 | Temp 97.9°F | Ht 65.0 in | Wt 231.2 lb

## 2023-04-22 DIAGNOSIS — I10 Essential (primary) hypertension: Secondary | ICD-10-CM

## 2023-04-22 DIAGNOSIS — E782 Mixed hyperlipidemia: Secondary | ICD-10-CM | POA: Diagnosis not present

## 2023-04-22 DIAGNOSIS — E063 Autoimmune thyroiditis: Secondary | ICD-10-CM

## 2023-04-22 DIAGNOSIS — E538 Deficiency of other specified B group vitamins: Secondary | ICD-10-CM

## 2023-04-22 DIAGNOSIS — N939 Abnormal uterine and vaginal bleeding, unspecified: Secondary | ICD-10-CM

## 2023-04-22 DIAGNOSIS — E559 Vitamin D deficiency, unspecified: Secondary | ICD-10-CM

## 2023-04-22 DIAGNOSIS — E038 Other specified hypothyroidism: Secondary | ICD-10-CM

## 2023-04-22 DIAGNOSIS — N920 Excessive and frequent menstruation with regular cycle: Secondary | ICD-10-CM

## 2023-04-22 DIAGNOSIS — J301 Allergic rhinitis due to pollen: Secondary | ICD-10-CM

## 2023-04-22 DIAGNOSIS — R7989 Other specified abnormal findings of blood chemistry: Secondary | ICD-10-CM | POA: Diagnosis not present

## 2023-04-22 DIAGNOSIS — R7303 Prediabetes: Secondary | ICD-10-CM

## 2023-04-22 LAB — CBC
HCT: 40 % (ref 36.0–46.0)
Hemoglobin: 13.5 g/dL (ref 12.0–15.0)
MCHC: 33.8 g/dL (ref 30.0–36.0)
MCV: 86.7 fl (ref 78.0–100.0)
Platelets: 299 10*3/uL (ref 150.0–400.0)
RBC: 4.62 Mil/uL (ref 3.87–5.11)
RDW: 14.1 % (ref 11.5–15.5)
WBC: 9.7 10*3/uL (ref 4.0–10.5)

## 2023-04-22 LAB — HEPATIC FUNCTION PANEL
ALT: 24 U/L (ref 0–35)
AST: 22 U/L (ref 0–37)
Albumin: 4 g/dL (ref 3.5–5.2)
Alkaline Phosphatase: 63 U/L (ref 39–117)
Bilirubin, Direct: 0.1 mg/dL (ref 0.0–0.3)
Total Bilirubin: 0.4 mg/dL (ref 0.2–1.2)
Total Protein: 6.6 g/dL (ref 6.0–8.3)

## 2023-04-22 LAB — VITAMIN D 25 HYDROXY (VIT D DEFICIENCY, FRACTURES): VITD: 19.01 ng/mL — ABNORMAL LOW (ref 30.00–100.00)

## 2023-04-22 LAB — TSH: TSH: 3.06 u[IU]/mL (ref 0.35–5.50)

## 2023-04-22 LAB — VITAMIN B12: Vitamin B-12: 188 pg/mL — ABNORMAL LOW (ref 211–911)

## 2023-04-22 LAB — HEMOGLOBIN A1C: Hgb A1c MFr Bld: 6.2 % (ref 4.6–6.5)

## 2023-04-22 NOTE — Assessment & Plan Note (Signed)
Ordered vitamin d pending results.  Start daily supplementation

## 2023-04-22 NOTE — Assessment & Plan Note (Signed)
Repeat thyroid pending results continue levothyroxine 75 mcg once daily

## 2023-04-22 NOTE — Assessment & Plan Note (Signed)
Ordered lipid panel, pending results. Work on low cholesterol diet and exercise as tolerated Continue simvastatin 10 mg qhs

## 2023-04-22 NOTE — Progress Notes (Signed)
Established Patient Office Visit  Subjective:      CC:  Chief Complaint  Patient presents with   Medical Management of Chronic Issues    HPI: Katherine Callahan is a 47 y.o. female presenting on 04/22/2023 for Medical Management of Chronic Issues . HTN: today in office 124/72  Average at home 120/80  No cp palp and or sob no pedal edema. Currently on losartan 50 mg once daily.   Once in a while when she takes a deep breath she feels a slight pain. Not daily only once or twice over the last one month. Not stabbing. Not tingling. Not wheezing. Last episode was last week for one Seifer.   Tsh elevated not at goal 1/24 still on levothyroxine 75 mcg once daily. Separate from food and or other medication.   Elevated lfts in the past  Last metabolic panel Lab Results  Component Value Date   GLUCOSE 79 09/16/2022   NA 140 09/16/2022   K 4.2 09/16/2022   CL 99 09/16/2022   CO2 22 09/16/2022   BUN 11 09/16/2022   CREATININE 0.79 09/16/2022   EGFR 92 09/16/2022   CALCIUM 9.6 09/16/2022   PROT 7.4 09/16/2022   ALBUMIN 4.6 09/16/2022   LABGLOB 2.8 09/16/2022   AGRATIO 1.6 09/16/2022   BILITOT 0.2 09/16/2022   ALKPHOS 71 09/16/2022   AST 57 (H) 09/16/2022   ALT 43 (H) 09/16/2022   Menstrual cycles heavier and u/s recommended by obgyn . Possible ablation. She will get these scheduled.   HLD: on simvastatin 10 mg qhs doing well. Trying to work on diet.     Social history:  Relevant past medical, surgical, family and social history reviewed and updated as indicated. Interim medical history since our last visit reviewed.  Allergies and medications reviewed and updated.  DATA REVIEWED: CHART IN EPIC     ROS: Negative unless specifically indicated above in HPI.    Current Outpatient Medications:    levothyroxine (SYNTHROID) 75 MCG tablet, TAKE 1 TABLET BY MOUTH EVERY [place], Disp: 90 tablet, Rfl: 1   losartan (COZAAR) 50 MG tablet, Take 1 tablet (50 mg total) by mouth daily.,  Disp: 90 tablet, Rfl: 1   sertraline (ZOLOFT) 50 MG tablet, TAKE 1 TABLET BY MOUTH EVERY [place], Disp: 90 tablet, Rfl: 2   simvastatin (ZOCOR) 10 MG tablet, Take 1 tablet (10 mg total) by mouth at bedtime., Disp: 90 tablet, Rfl: 3   tranexamic acid (LYSTEDA) 650 MG TABS tablet, Take 2 tablets (1,300 mg total) by mouth 3 (three) times daily. Take during menses for a maximum of five days, Disp: 30 tablet, Rfl: 2      Objective:    BP 124/72 (BP Location: Left Arm)   Pulse 94   Temp 97.9 F (36.6 C) (Temporal)   Ht 5\' 5"  (1.651 m)   Wt 231 lb 3.2 oz (104.9 kg)   LMP 04/03/2023   SpO2 98%   BMI 38.47 kg/m   Wt Readings from Last 3 Encounters:  04/22/23 231 lb 3.2 oz (104.9 kg)  04/12/23 231 lb (104.8 kg)  12/31/22 227 lb (103 kg)    Physical Exam Constitutional:      General: She is not in acute distress.    Appearance: Normal appearance. She is obese. She is not ill-appearing, toxic-appearing or diaphoretic.  HENT:     Head: Normocephalic.  Cardiovascular:     Rate and Rhythm: Normal rate and regular rhythm.  Pulmonary:  Effort: Pulmonary effort is normal.  Musculoskeletal:        General: Normal range of motion.  Neurological:     General: No focal deficit present.     Mental Status: She is alert and oriented to person, place, and time. Mental status is at baseline.  Psychiatric:        Mood and Affect: Mood normal.        Behavior: Behavior normal.        Thought Content: Thought content normal.        Judgment: Judgment normal.            Assessment & Plan:  Low serum vitamin B12 -     Vitamin B12  Vitamin D deficiency Assessment & Plan: Ordered vitamin d pending results.  Start daily supplementation   Orders: -     VITAMIN D 25 Hydroxy (Vit-D Deficiency, Fractures)  Prediabetes -     Hemoglobin A1c  Mixed hyperlipidemia Assessment & Plan: Ordered lipid panel, pending results. Work on low cholesterol diet and exercise as tolerated Continue  simvastatin 10 mg qhs   Orders: -     Lipid panel; Future  Primary hypertension  Elevated LFTs -     Hepatic function panel  Hypothyroidism due to Hashimoto's thyroiditis Assessment & Plan: Repeat thyroid pending results continue levothyroxine 75 mcg once daily   Orders: -     TSH  Menorrhagia with regular cycle -     CBC  Seasonal allergic rhinitis due to pollen  Essential hypertension Assessment & Plan: Pt advised of the following:  Continue medication as prescribed. Monitor blood pressure periodically and/or when you feel symptomatic. Goal is <130/90 on average. Ensure that you have rested for 30 minutes prior to checking your blood pressure. Record your readings and bring them to your next visit if necessary.work on a low sodium diet. Continue losartan 100 mg once daily    Abnormal uterine bleeding Assessment & Plan: Assess for iron def anemia     Pre-diabetes Assessment & Plan: Pt advised of the following: Work on a diabetic diet, try to incorporate exercise at least 20-30 a [place] for 3 days a week or more.        Return in about 6 months (around 10/23/2023) for f/u cholesterol, f/u CPE.  Mort Sawyers, MSN, APRN, FNP-C [facility]

## 2023-04-22 NOTE — Assessment & Plan Note (Signed)
Assess for iron def anemia

## 2023-04-22 NOTE — Assessment & Plan Note (Signed)
Pt advised of the following:  Continue medication as prescribed. Monitor blood pressure periodically and/or when you feel symptomatic. Goal is <130/90 on average. Ensure that you have rested for 30 minutes prior to checking your blood pressure. Record your readings and bring them to your next visit if necessary.work on a low sodium diet. Continue losartan 100 mg once daily

## 2023-04-22 NOTE — Assessment & Plan Note (Signed)
Pt advised of the following: Work on a diabetic diet, try to incorporate exercise at least 20-30 a [place] for 3 days a week or more.   

## 2023-04-23 ENCOUNTER — Other Ambulatory Visit: Payer: Self-pay | Admitting: Family

## 2023-04-23 ENCOUNTER — Encounter: Payer: Self-pay | Admitting: Family Medicine

## 2023-04-23 DIAGNOSIS — E038 Other specified hypothyroidism: Secondary | ICD-10-CM

## 2023-04-23 MED ORDER — LEVOTHYROXINE SODIUM 88 MCG PO TABS
88.0000 ug | ORAL_TABLET | Freq: Every day | ORAL | 3 refills | Status: DC
Start: 2023-04-23 — End: 2023-12-21

## 2023-04-27 ENCOUNTER — Encounter: Payer: Self-pay | Admitting: Family

## 2023-05-07 ENCOUNTER — Encounter: Payer: Self-pay | Admitting: Family Medicine

## 2023-05-11 ENCOUNTER — Telehealth: Payer: Self-pay

## 2023-05-11 NOTE — Telephone Encounter (Signed)
Called patient to see if she was available on 06/15/23 at 3:30 pm/[facility] for the procedure with Dr. Shawnie Pons? No answer left a return phone number, and voicemail asking the patient to call for scheduling. [phone].

## 2023-05-12 ENCOUNTER — Ambulatory Visit: Payer: BC Managed Care – PPO | Admitting: Family Medicine

## 2023-05-27 ENCOUNTER — Other Ambulatory Visit: Payer: BC Managed Care – PPO

## 2023-06-07 ENCOUNTER — Ambulatory Visit: Payer: BC Managed Care – PPO | Admitting: Family Medicine

## 2023-06-09 ENCOUNTER — Other Ambulatory Visit: Payer: BC Managed Care – PPO

## 2023-07-14 ENCOUNTER — Other Ambulatory Visit (INDEPENDENT_AMBULATORY_CARE_PROVIDER_SITE_OTHER): Payer: BC Managed Care – PPO

## 2023-07-14 DIAGNOSIS — E038 Other specified hypothyroidism: Secondary | ICD-10-CM | POA: Diagnosis not present

## 2023-07-14 DIAGNOSIS — E063 Autoimmune thyroiditis: Secondary | ICD-10-CM | POA: Diagnosis not present

## 2023-07-14 DIAGNOSIS — E782 Mixed hyperlipidemia: Secondary | ICD-10-CM | POA: Diagnosis not present

## 2023-07-14 LAB — LDL CHOLESTEROL, DIRECT: Direct LDL: 114 mg/dL

## 2023-07-14 LAB — LIPID PANEL
Cholesterol: 191 mg/dL (ref 0–200)
HDL: 49.2 mg/dL (ref >39.00)
NonHDL: 141.95
Total CHOL/HDL Ratio: 4
Triglycerides: 260 mg/dL — ABNORMAL HIGH (ref 0.0–149.0)
VLDL: 52 mg/dL — ABNORMAL HIGH (ref 0.0–40.0)

## 2023-07-14 LAB — TSH: TSH: 1.63 u[IU]/mL (ref 0.35–5.50)

## 2023-07-14 NOTE — Addendum Note (Signed)
Addended by: Mort Sawyers on: 07/14/2023 08:06 AM   Modules accepted: Orders

## 2023-07-30 ENCOUNTER — Encounter: Payer: Self-pay | Admitting: Family

## 2023-07-30 DIAGNOSIS — E782 Mixed hyperlipidemia: Secondary | ICD-10-CM

## 2023-07-30 DIAGNOSIS — R7303 Prediabetes: Secondary | ICD-10-CM

## 2023-07-30 DIAGNOSIS — E669 Obesity, unspecified: Secondary | ICD-10-CM

## 2023-07-30 DIAGNOSIS — I10 Essential (primary) hypertension: Secondary | ICD-10-CM

## 2023-08-02 ENCOUNTER — Encounter: Payer: Self-pay | Admitting: Family

## 2023-08-02 MED ORDER — SIMVASTATIN 20 MG PO TABS
20.0000 mg | ORAL_TABLET | Freq: Every day | ORAL | 3 refills | Status: DC
Start: 2023-08-02 — End: 2024-01-23

## 2023-08-02 NOTE — Telephone Encounter (Signed)
Please see the MyChart message reply(ies) for my assessment and plan.  The patient gave consent for this Medical Advice Message and is aware that it may result in a bill to their insurance company as well as the possibility that this may result in a co-payment or deductible. They are an established patient, but are not seeking medical advice exclusively about a problem treated during an in person or video visit in the last 7 days. I did not recommend an in person or video visit within 7 days of my reply.  I spent a total of 4 minutes cumulative time within 7 days through [employer] Mort Sawyers, FNP

## 2023-08-05 MED ORDER — WEGOVY 0.25 MG/0.5ML ~~LOC~~ SOAJ: SUBCUTANEOUS | 0 refills | Status: DC

## 2023-08-05 MED ORDER — WEGOVY 0.5 MG/0.5ML ~~LOC~~ SOAJ: SUBCUTANEOUS | 2 refills | Status: DC

## 2023-08-05 NOTE — Addendum Note (Signed)
Addended by: Mort Sawyers on: 08/05/2023 02:51 PM   Modules accepted: Orders

## 2023-08-16 ENCOUNTER — Other Ambulatory Visit: Payer: Self-pay | Admitting: Family

## 2023-08-16 MED ORDER — LOSARTAN POTASSIUM 50 MG PO TABS
50.0000 mg | ORAL_TABLET | Freq: Every day | ORAL | 1 refills | Status: DC
Start: 2023-08-16 — End: 2024-09-21

## 2023-08-16 NOTE — Addendum Note (Signed)
Addended by: Mort Sawyers on: 08/16/2023 08:35 PM   Modules accepted: Orders

## 2023-08-17 ENCOUNTER — Encounter: Payer: Self-pay | Admitting: Family Medicine

## 2023-08-25 ENCOUNTER — Encounter (HOSPITAL_BASED_OUTPATIENT_CLINIC_OR_DEPARTMENT_OTHER): Payer: Self-pay | Admitting: Obstetrics & Gynecology

## 2023-08-25 ENCOUNTER — Ambulatory Visit (HOSPITAL_BASED_OUTPATIENT_CLINIC_OR_DEPARTMENT_OTHER): Payer: BC Managed Care – PPO | Admitting: Obstetrics & Gynecology

## 2023-08-25 ENCOUNTER — Ambulatory Visit (HOSPITAL_BASED_OUTPATIENT_CLINIC_OR_DEPARTMENT_OTHER): Payer: BC Managed Care – PPO

## 2023-08-25 VITALS — BP 148/103 | HR 108 | Ht 65.0 in | Wt 231.4 lb

## 2023-08-25 DIAGNOSIS — R9389 Abnormal findings on diagnostic imaging of other specified body structures: Secondary | ICD-10-CM

## 2023-08-25 DIAGNOSIS — N939 Abnormal uterine and vaginal bleeding, unspecified: Secondary | ICD-10-CM

## 2023-08-25 NOTE — Progress Notes (Signed)
47 yo G3P3 MWF with h/o AUB who has seen Dr. Shawnie Pons for discussion of treatment options in 03/2023.  Due to endometrial thickening, sonohysterogram ordered.  Pt had difficulty getting this scheduled through [facility] so was referred to [facility] location.    Procedure was performed without difficulty.  No endometrial abnormalities noted.  Probable adenomyosis in myometrium noted.  Results discussed with pt.  Endometrial biopsy recommended.  She is going to return to Dr. Shawnie Pons for discussion of treatment and is deciding between endometrial ablation or hysterectomy.  She is aware she will need endometrial biopsy but will defer having this done until she discusses plan with Dr. Shawnie Pons.   Assessment/Plan: 1. Abnormal uterine bleeding -Results for ultrasound and discussion with pt will be forwarded and will communicate with [facility] office so aware this has been completed and follow needs to be scheduled.    2. Thickened endometrium - Endometrial biopsy recommended and she will follow up with Dr. Shawnie Pons for this.

## 2023-08-31 NOTE — Progress Notes (Signed)
Noted  

## 2023-09-09 ENCOUNTER — Encounter: Payer: Self-pay | Admitting: Family Medicine

## 2023-09-15 ENCOUNTER — Other Ambulatory Visit: Payer: BC Managed Care – PPO | Admitting: Family Medicine

## 2023-09-22 ENCOUNTER — Encounter: Payer: Self-pay | Admitting: Family Medicine

## 2023-09-23 ENCOUNTER — Other Ambulatory Visit: Payer: Self-pay | Admitting: Family Medicine

## 2023-09-23 DIAGNOSIS — Z1231 Encounter for screening mammogram for malignant neoplasm of breast: Secondary | ICD-10-CM

## 2023-09-29 ENCOUNTER — Ambulatory Visit: Payer: BC Managed Care – PPO | Admitting: Family Medicine

## 2023-09-29 ENCOUNTER — Encounter: Payer: Self-pay | Admitting: Family Medicine

## 2023-09-29 ENCOUNTER — Other Ambulatory Visit (HOSPITAL_COMMUNITY)
Admission: RE | Admit: 2023-09-29 | Discharge: 2023-09-29 | Disposition: A | Discharge status: Home / Self Care | Payer: BC Managed Care – PPO | Source: Ambulatory Visit | Attending: Family Medicine | Admitting: Family Medicine

## 2023-09-29 VITALS — BP 127/85 | HR 99 | Wt 230.0 lb

## 2023-09-29 DIAGNOSIS — N920 Excessive and frequent menstruation with regular cycle: Secondary | ICD-10-CM

## 2023-09-29 NOTE — Assessment & Plan Note (Signed)
S/p EMB today. Sonohyst, failed to show polyp. Considering treatment with ablation vs. Hysterectomy. R/B/A to each discussed, surgical risks reviewed. If chooses ablation, would need progesterone for 2-4 weeks prior to . Await biopsy and then she will decide. If hysterectomy, would do TVH.

## 2023-09-29 NOTE — Progress Notes (Signed)
RGYN patient here for EMB.  LMP:09/09/23

## 2023-09-29 NOTE — Progress Notes (Signed)
   Subjective:    Patient ID: Seriyah Collison Kapler is a 47 y.o. female presenting with Consult  on 09/29/2023  HPI: Having some pain with cycles. Cycles are not that heavy. Has h/o adenomyosis, and cycles are causing a lot of pain. She is using Lysteda, which helps. She is changing one tampon/hour. She is waking in the night to change tampons/pads, so bleeding remains heavy.   Review of Systems  Constitutional:  Negative for chills and fever.  Respiratory:  Negative for shortness of breath.   Cardiovascular:  Negative for chest pain.  Gastrointestinal:  Negative for abdominal pain, nausea and vomiting.  Genitourinary:  Negative for dysuria.  Skin:  Negative for rash.      Objective:    BP 127/85   Pulse 99   Wt 230 lb (104.3 kg)   LMP 09/09/2023 (Approximate)   BMI 38.27 kg/m  Physical Exam Exam conducted with a chaperone present.  Constitutional:      General: She is not in acute distress.    Appearance: She is well-developed.  HENT:     Head: Normocephalic and atraumatic.  Eyes:     General: No scleral icterus. Cardiovascular:     Rate and Rhythm: Normal rate.  Pulmonary:     Effort: Pulmonary effort is normal.  Abdominal:     Palpations: Abdomen is soft.  Musculoskeletal:     Cervical back: Neck supple.  Skin:    General: Skin is warm and dry.  Neurological:     Mental Status: She is alert and oriented to person, place, and time.    Procedure: Patient given informed consent, signed copy in the chart, time out was performed. Appropriate time out taken. . The patient was placed in the lithotomy position and the cervix brought into view with sterile speculum.  Portio of cervix cleansed x 2 with betadine swabs.  A tenaculum was placed in the anterior lip of the cervix.  The uterus was sounded for depth of 9 mm. A pipelle was introduced to into the uterus, suction created,  and an endometrial sample was obtained. All equipment was removed and accounted for.  The patient  tolerated the procedure well.       Assessment & Plan:   Problem List Items Addressed This Visit       Unprioritized   Menorrhagia with regular cycle - Primary    S/p EMB today. Sonohyst, failed to show polyp. Considering treatment with ablation vs. Hysterectomy. R/B/A to each discussed, surgical risks reviewed. If chooses ablation, would need progesterone for 2-4 weeks prior to . Await biopsy and then she will decide. If hysterectomy, would do TVH.      Relevant Orders   Surgical pathology( [place]/ [facility])    Return in about 3 months (around 12/30/2023) for postop check.  Reva Bores, MD 09/29/2023 1:25 PM

## 2023-09-30 LAB — SURGICAL PATHOLOGY

## 2023-10-05 ENCOUNTER — Ambulatory Visit
Admission: RE | Admit: 2023-10-05 | Discharge: 2023-10-05 | Disposition: A | Discharge status: Home / Self Care | Payer: BC Managed Care – PPO | Source: Ambulatory Visit | Attending: Family Medicine | Admitting: Family Medicine

## 2023-10-05 DIAGNOSIS — Z1231 Encounter for screening mammogram for malignant neoplasm of breast: Secondary | ICD-10-CM | POA: Diagnosis present

## 2023-10-07 ENCOUNTER — Other Ambulatory Visit: Payer: Self-pay | Admitting: Family Medicine

## 2023-10-07 DIAGNOSIS — R928 Other abnormal and inconclusive findings on diagnostic imaging of breast: Secondary | ICD-10-CM

## 2023-10-13 ENCOUNTER — Ambulatory Visit
Admission: RE | Admit: 2023-10-13 | Discharge: 2023-10-13 | Disposition: A | Discharge status: Home / Self Care | Payer: BC Managed Care – PPO | Source: Ambulatory Visit | Attending: Family Medicine | Admitting: Family Medicine

## 2023-10-13 DIAGNOSIS — R928 Other abnormal and inconclusive findings on diagnostic imaging of breast: Secondary | ICD-10-CM

## 2023-10-19 ENCOUNTER — Other Ambulatory Visit: Payer: BC Managed Care – PPO

## 2023-12-21 ENCOUNTER — Telehealth: Payer: Self-pay

## 2023-12-21 ENCOUNTER — Encounter: Payer: Self-pay | Admitting: Family

## 2023-12-21 ENCOUNTER — Other Ambulatory Visit: Payer: Self-pay | Admitting: Family

## 2023-12-21 ENCOUNTER — Ambulatory Visit: Payer: BC Managed Care – PPO | Admitting: Family

## 2023-12-21 VITALS — BP 130/88 | HR 104 | Temp 98.6°F | Ht 65.0 in | Wt 231.2 lb

## 2023-12-21 DIAGNOSIS — R059 Cough, unspecified: Secondary | ICD-10-CM | POA: Diagnosis not present

## 2023-12-21 DIAGNOSIS — J0140 Acute pansinusitis, unspecified: Secondary | ICD-10-CM | POA: Diagnosis not present

## 2023-12-21 DIAGNOSIS — I10 Essential (primary) hypertension: Secondary | ICD-10-CM

## 2023-12-21 DIAGNOSIS — R7303 Prediabetes: Secondary | ICD-10-CM

## 2023-12-21 DIAGNOSIS — E559 Vitamin D deficiency, unspecified: Secondary | ICD-10-CM

## 2023-12-21 DIAGNOSIS — J029 Acute pharyngitis, unspecified: Secondary | ICD-10-CM | POA: Diagnosis not present

## 2023-12-21 DIAGNOSIS — E063 Autoimmune thyroiditis: Secondary | ICD-10-CM

## 2023-12-21 DIAGNOSIS — E669 Obesity, unspecified: Secondary | ICD-10-CM

## 2023-12-21 DIAGNOSIS — E782 Mixed hyperlipidemia: Secondary | ICD-10-CM

## 2023-12-21 DIAGNOSIS — B3731 Acute candidiasis of vulva and vagina: Secondary | ICD-10-CM

## 2023-12-21 DIAGNOSIS — E538 Deficiency of other specified B group vitamins: Secondary | ICD-10-CM | POA: Insufficient documentation

## 2023-12-21 LAB — POC COVID19 BINAXNOW: SARS Coronavirus 2 Ag: NEGATIVE

## 2023-12-21 LAB — POCT RAPID STREP A (OFFICE): Rapid Strep A Screen: NEGATIVE

## 2023-12-21 MED ORDER — LEVOTHYROXINE SODIUM 88 MCG PO TABS: 88.0000 ug | ORAL_TABLET | Freq: Every day | ORAL | 3 refills | Status: DC

## 2023-12-21 MED ORDER — CEFDINIR 300 MG PO CAPS: 300.0000 mg | ORAL_CAPSULE | Freq: Two times a day (BID) | ORAL | 0 refills | Status: AC

## 2023-12-21 MED ORDER — FLUCONAZOLE 150 MG PO TABS: 150.0000 mg | ORAL_TABLET | Freq: Once | ORAL | 0 refills | Status: AC

## 2023-12-21 NOTE — Telephone Encounter (Signed)
 Copied from CRM [id number]. Topic: Clinical - Prescription Issue >> Dec 21, 2023  9:39 AM Graeme ORN wrote: Reason for CRM: Patient called states she was just seen and provider wanted her to renew her Rx for levothyroxine  (SYNTHROID ) 88 MCG tablet. Pt states pharmacy needs provider approval in order to fill. Thank You

## 2023-12-21 NOTE — Assessment & Plan Note (Signed)
 Prescription given for cefdinir 300 mg po bid x 10 days, pcn only with rash. Pt to continue tylenol/ibuprofen prn sinus pain. Continue with humidifier prn and steam showers recommended as well. instructed If no symptom improvement in 48 hours please f/u

## 2023-12-21 NOTE — Assessment & Plan Note (Signed)
Non compliant self d/c meds  Discussed on importance of taking meds as instructed, she will restart.  Repeat tsh in three weeks and return to office for f/u appt.

## 2023-12-21 NOTE — Assessment & Plan Note (Signed)
 Non compliant self d/c meds with statin. However might have s/e of myalgia. Pt will do trial restart and let me know if again with s/e.  Discussed on importance of taking meds as instructed, she will restart.

## 2023-12-21 NOTE — Progress Notes (Signed)
 Established Patient Office Visit  Subjective:   Patient ID: Katherine Callahan, female    DOB: 07/02/1976  Age: 47 y.o. MRN: [id number]  CC:  Chief Complaint  Patient presents with   Acute Visit    Reports cough, sinus congestion, PND. Cough is producing light green mucus. Denies fever, sore throat.    HPI: Katherine Callahan is a 47 y.o. female presenting on 12/21/2023 for Acute Visit (Reports cough, sinus congestion, PND. Cough is producing light green mucus. Denies fever, sore throat.)  Covid and strep neg in our office. Started with symptoms about six days ago with sore throat, nasal congestion, and then cough started three days ago with some slight chest congestion. She is just recently having a productive cough with green mucous. Some slight ear pain bil and her teeth hurt, sinus pressure.   Otc meds to include zyrtec D without much relief. Nyquil at night time        ROS: Negative unless specifically indicated above in HPI.   Relevant past medical history reviewed and updated as indicated.   Allergies and medications reviewed and updated.   Current Outpatient Medications:    cefdinir  (OMNICEF ) 300 MG capsule, Take 1 capsule (300 mg total) by mouth 2 (two) times daily for 10 days., Disp: 20 capsule, Rfl: 0   fluconazole  (DIFLUCAN ) 150 MG tablet, Take 1 tablet (150 mg total) by mouth once for 1 dose., Disp: 1 tablet, Rfl: 0   levothyroxine  (SYNTHROID ) 88 MCG tablet, Take 1 tablet (88 mcg total) by mouth daily., Disp: 90 tablet, Rfl: 3   tranexamic acid  (LYSTEDA ) 650 MG TABS tablet, Take 2 tablets (1,300 mg total) by mouth 3 (three) times daily. Take during menses for a maximum of five days, Disp: 30 tablet, Rfl: 2   losartan  (COZAAR ) 50 MG tablet, Take 1 tablet (50 mg total) by mouth daily. (Patient not taking: Reported on 12/21/2023), Disp: 90 tablet, Rfl: 1   simvastatin  (ZOCOR ) 20 MG tablet, Take 1 tablet (20 mg total) by mouth at bedtime. (Patient not taking: Reported on  12/21/2023), Disp: 90 tablet, Rfl: 3  Allergies  Allergen Reactions   Aspirin Other (See Comments)    Nose bleed   Codeine Nausea Only   Crestor  [Rosuvastatin ] Other (See Comments)    myalgias   Amlodipine  Other (See Comments)    Ankle swelling at 10 mg dose   Penicillin G Rash   Sulfa Antibiotics Rash    Objective:   BP 130/88 (BP Location: Left Arm, Patient Position: Sitting, Cuff Size: Large)   Pulse (!) 104   Temp 98.6 F (37 C) (Temporal)   Ht 5' 5 (1.651 m)   Wt 231 lb 3.2 oz (104.9 kg)   SpO2 98%   BMI 38.47 kg/m    Physical Exam Constitutional:      General: She is not in acute distress.    Appearance: Normal appearance. She is normal weight. She is not ill-appearing, toxic-appearing or diaphoretic.  HENT:     Head: Normocephalic.     Right Ear: Tympanic membrane normal.     Left Ear: Tympanic membrane normal.     Nose: Nose normal.     Mouth/Throat:     Mouth: Mucous membranes are dry.     Pharynx: No oropharyngeal exudate or posterior oropharyngeal erythema.  Eyes:     Extraocular Movements: Extraocular movements intact.     Pupils: Pupils are equal, round, and reactive to light.  Cardiovascular:     Rate  and Rhythm: Normal rate and regular rhythm.     Pulses: Normal pulses.     Heart sounds: Normal heart sounds.  Pulmonary:     Effort: Pulmonary effort is normal.     Breath sounds: Normal breath sounds.  Musculoskeletal:     Cervical back: Normal range of motion.  Neurological:     General: No focal deficit present.     Mental Status: She is alert and oriented to person, place, and time. Mental status is at baseline.  Psychiatric:        Mood and Affect: Mood normal.        Behavior: Behavior normal.        Thought Content: Thought content normal.        Judgment: Judgment normal.     Assessment & Plan:  Cough, unspecified type -     POC COVID-19 BinaxNow  Sore throat -     POCT rapid strep A  Acute non-recurrent  pansinusitis Assessment & Plan: Prescription given for cefdinir  300 mg po bid x 10 days, pcn only with rash. Pt to continue tylenol/ibuprofen prn sinus pain. Continue with humidifier prn and steam showers recommended as well. instructed If no symptom improvement in 48 hours please f/u   Orders: -     Cefdinir ; Take 1 capsule (300 mg total) by mouth 2 (two) times daily for 10 days.  Dispense: 20 capsule; Refill: 0  Vaginal candida -     Fluconazole ; Take 1 tablet (150 mg total) by mouth once for 1 dose.  Dispense: 1 tablet; Refill: 0  Obesity (BMI 35.0-39.9 without comorbidity)  Hypothyroidism due to Hashimoto's thyroiditis Assessment & Plan: Non compliant self d/c meds  Discussed on importance of taking meds as instructed, she will restart.  Repeat tsh in three weeks and return to office for f/u appt.   Orders: -     TSH; Future  Essential hypertension Assessment & Plan: Non compliant self d/c meds  Discussed on importance of taking meds as instructed, she will restart.    Vitamin D  deficiency -     VITAMIN D  25 Hydroxy (Vit-D Deficiency, Fractures); Future  Pre-diabetes -     Hemoglobin A1c; Future  Mixed hyperlipidemia Assessment & Plan: Non compliant self d/c meds with statin. However might have s/e of myalgia. Pt will do trial restart and let me know if again with s/e.  Discussed on importance of taking meds as instructed, she will restart.   Orders: -     Lipid panel; Future  Low serum vitamin B12 -     Vitamin B12; Future     Follow up plan: Return in about 1 month (around 01/21/2024) for f/u blood pressure, f/u cholesterol.  [name], FNP

## 2023-12-21 NOTE — Assessment & Plan Note (Signed)
 Non compliant self d/c meds  Discussed on importance of taking meds as instructed, she will restart.

## 2024-01-21 ENCOUNTER — Encounter: Payer: Self-pay | Admitting: Family

## 2024-01-21 ENCOUNTER — Other Ambulatory Visit (INDEPENDENT_AMBULATORY_CARE_PROVIDER_SITE_OTHER): Payer: 59

## 2024-01-21 DIAGNOSIS — E782 Mixed hyperlipidemia: Secondary | ICD-10-CM

## 2024-01-21 DIAGNOSIS — E559 Vitamin D deficiency, unspecified: Secondary | ICD-10-CM

## 2024-01-21 DIAGNOSIS — R7303 Prediabetes: Secondary | ICD-10-CM | POA: Diagnosis not present

## 2024-01-21 DIAGNOSIS — E538 Deficiency of other specified B group vitamins: Secondary | ICD-10-CM | POA: Diagnosis not present

## 2024-01-21 DIAGNOSIS — E063 Autoimmune thyroiditis: Secondary | ICD-10-CM | POA: Diagnosis not present

## 2024-01-21 LAB — LIPID PANEL
Cholesterol: 215 mg/dL — ABNORMAL HIGH (ref 0–200)
HDL: 50 mg/dL (ref >39.00)
LDL Cholesterol: 127 mg/dL — ABNORMAL HIGH (ref 0–99)
NonHDL: 164.57
Total CHOL/HDL Ratio: 4
Triglycerides: 188 mg/dL — ABNORMAL HIGH (ref 0.0–149.0)
VLDL: 37.6 mg/dL (ref 0.0–40.0)

## 2024-01-21 LAB — VITAMIN D 25 HYDROXY (VIT D DEFICIENCY, FRACTURES): VITD: 23.61 ng/mL — ABNORMAL LOW (ref 30.00–100.00)

## 2024-01-21 LAB — TSH: TSH: 1.98 u[IU]/mL (ref 0.35–5.50)

## 2024-01-21 LAB — HEPATIC FUNCTION PANEL
ALT: 22 U/L (ref 0–35)
AST: 24 U/L (ref 0–37)
Albumin: 4.1 g/dL (ref 3.5–5.2)
Alkaline Phosphatase: 52 U/L (ref 39–117)
Bilirubin, Direct: 0.1 mg/dL (ref 0.0–0.3)
Total Bilirubin: 0.6 mg/dL (ref 0.2–1.2)
Total Protein: 6.6 g/dL (ref 6.0–8.3)

## 2024-01-21 LAB — HEMOGLOBIN A1C: Hgb A1c MFr Bld: 6.5 % (ref 4.6–6.5)

## 2024-01-21 LAB — VITAMIN B12: Vitamin B-12: 231 pg/mL (ref 211–911)

## 2024-01-21 NOTE — Addendum Note (Signed)
Addended by: Alvina Chou on: 01/21/2024 08:02 AM   Modules accepted: Orders

## 2024-01-23 MED ORDER — LOVASTATIN 20 MG PO TABS: 20.0000 mg | ORAL_TABLET | Freq: Every day | ORAL | 3 refills | Status: DC

## 2024-01-23 NOTE — Telephone Encounter (Signed)
Do you happen to know of any synthroid reps? I want to run this by them to see if they can recommend a way around this s/e? I looked it up and it is listed as an AR (alopecia) and I see it on [employer]ros[employer] and brand synthroid. Thanks!

## 2024-01-24 ENCOUNTER — Other Ambulatory Visit: Payer: Self-pay | Admitting: Family

## 2024-01-24 ENCOUNTER — Encounter: Payer: Self-pay | Admitting: Family

## 2024-01-24 DIAGNOSIS — E559 Vitamin D deficiency, unspecified: Secondary | ICD-10-CM

## 2024-01-24 MED ORDER — CHOLECALCIFEROL 1.25 MG (50000 UT) PO TABS: 1.0000 | ORAL_TABLET | ORAL | 0 refills | Status: DC

## 2024-01-24 NOTE — Telephone Encounter (Signed)
Thanks Katherine Callahan I have placed an email to him.

## 2024-01-31 ENCOUNTER — Ambulatory Visit: Payer: Self-pay | Admitting: Family

## 2024-02-01 ENCOUNTER — Encounter: Payer: Self-pay | Admitting: Family

## 2024-02-01 ENCOUNTER — Ambulatory Visit: Payer: 59 | Admitting: Family

## 2024-02-01 ENCOUNTER — Telehealth: Payer: Self-pay | Admitting: Family

## 2024-02-01 VITALS — BP 132/86 | HR 89 | Temp 97.9°F | Ht 65.0 in | Wt 235.8 lb

## 2024-02-01 DIAGNOSIS — E119 Type 2 diabetes mellitus without complications: Secondary | ICD-10-CM

## 2024-02-01 DIAGNOSIS — E669 Obesity, unspecified: Secondary | ICD-10-CM

## 2024-02-01 DIAGNOSIS — E063 Autoimmune thyroiditis: Secondary | ICD-10-CM

## 2024-02-01 DIAGNOSIS — Z7984 Long term (current) use of oral hypoglycemic drugs: Secondary | ICD-10-CM

## 2024-02-01 DIAGNOSIS — E782 Mixed hyperlipidemia: Secondary | ICD-10-CM

## 2024-02-01 DIAGNOSIS — I10 Essential (primary) hypertension: Secondary | ICD-10-CM

## 2024-02-01 DIAGNOSIS — R12 Heartburn: Secondary | ICD-10-CM

## 2024-02-01 DIAGNOSIS — Z7985 Long-term (current) use of injectable non-insulin antidiabetic drugs: Secondary | ICD-10-CM

## 2024-02-01 MED ORDER — OMEPRAZOLE 20 MG PO CPDR: 20.0000 mg | DELAYED_RELEASE_CAPSULE | Freq: Every day | ORAL | 0 refills | Status: DC

## 2024-02-01 MED ORDER — METFORMIN HCL 500 MG PO TABS: 500.0000 mg | ORAL_TABLET | Freq: Every day | ORAL | 3 refills | Status: DC

## 2024-02-01 MED ORDER — OZEMPIC (0.25 OR 0.5 MG/DOSE) 2 MG/3ML ~~LOC~~ SOPN: 0.2500 mg | PEN_INJECTOR | SUBCUTANEOUS | 0 refills | Status: DC

## 2024-02-01 NOTE — Telephone Encounter (Signed)
Can we start prior auth for ozempic once weekly  Pt is diabetic

## 2024-02-01 NOTE — Assessment & Plan Note (Addendum)
Start lovastatin we will repeat cholesterol levels in three months   If s/e will consider repatha

## 2024-02-01 NOTE — Assessment & Plan Note (Signed)
Continue levothyroxine 88 mcg once daily.

## 2024-02-01 NOTE — Assessment & Plan Note (Signed)
Trial omeprazole 20 mg once daily for two weeks for cough  Try to decrease and or avoid spicy foods, fried fatty foods, and also caffeine and chocolate as these can increase heartburn symptoms.

## 2024-02-01 NOTE — Assessment & Plan Note (Addendum)
New dx  Start metformin 500 mg once daily   Will try to get ozempic approved.  The beneficiary does not have any FDA labeled contraindications to the requested agent including pregnancy, lactation, h/o medullary thyroid cancer or multiple endocrine neoplasia type II.

## 2024-02-01 NOTE — Progress Notes (Signed)
Established Patient Office Visit  Subjective:      CC:  Chief Complaint  Patient presents with   Medical Management of Chronic Issues    HPI: Katherine Callahan is a 48 y.o. female presenting on 02/01/2024 for Medical Management of Chronic Issues   Diabetes: at A1c 6.5 currently , new diagnosis. She wants to really work on a diet for now and recheck and then go from there as far as considering   Mixed hyperlipidemia: elevated LDL at 127. prescribed lovastatin 20 mg nightly but she has not yet started yet will pick up from pharmacy today.   HTN: on losartan 50 mg once daily. She has been back on this since December. She does not check her blood pressure at home.   Hypothyroid: thyroid stable 01/21/24 currently on levothyroxine 88 mcg once daily.   Vitamin D def: restarted on RX vitamin D3 50000   Allergic rhinitis: not currently taking anything for this. She does report dry cough that doesn't seem to go away and at times with pnd   Fyi, not over the last few weeks but prior to this about two weeks ago had a moment where she had an electric 'twinge' on her left chest side and then went away. She had been sitting on the couch when it happened but didn't last long just for a few minutes and went away. No sob or chest pain since and or palpitations.     Social history:  Relevant past medical, surgical, family and social history reviewed and updated as indicated. Interim medical history since our last visit reviewed.  Allergies and medications reviewed and updated.  DATA REVIEWED: CHART IN EPIC     ROS: Negative unless specifically indicated above in HPI.    Current Outpatient Medications:    levothyroxine (SYNTHROID) 88 MCG tablet, Take 1 tablet (88 mcg total) by mouth daily., Disp: 90 tablet, Rfl: 3   losartan (COZAAR) 50 MG tablet, Take 1 tablet (50 mg total) by mouth daily., Disp: 90 tablet, Rfl: 1   omeprazole (PRILOSEC) 20 MG capsule, Take 1 capsule (20 mg total) by mouth  daily for 14 days., Disp: 14 capsule, Rfl: 0   Semaglutide,0.25 or 0.5MG /DOS, (OZEMPIC, 0.25 OR 0.5 MG/DOSE,) 2 MG/3ML SOPN, Inject 0.25 mg into the skin once a week., Disp: 3 mL, Rfl: 0   tranexamic acid (LYSTEDA) 650 MG TABS tablet, Take 2 tablets (1,300 mg total) by mouth 3 (three) times daily. Take during menses for a maximum of five days, Disp: 30 tablet, Rfl: 2   Cholecalciferol 1.25 MG (50000 UT) TABS, Take 1 tablet by mouth once a week. (Patient not taking: Reported on 02/01/2024), Disp: 12 tablet, Rfl: 0   lovastatin (MEVACOR) 20 MG tablet, Take 1 tablet (20 mg total) by mouth at bedtime. (Patient not taking: Reported on 02/01/2024), Disp: 90 tablet, Rfl: 3   metFORMIN (GLUCOPHAGE) 500 MG tablet, Take 1 tablet (500 mg total) by mouth daily., Disp: 90 tablet, Rfl: 3      Objective:    BP 132/86   Pulse 89   Temp 97.9 F (36.6 C) (Oral)   Ht 5\' 5"  (1.651 m)   Wt 235 lb 12.8 oz (107 kg)   LMP 01/21/2024 (Approximate)   SpO2 98%   BMI 39.24 kg/m   Wt Readings from Last 3 Encounters:  02/01/24 235 lb 12.8 oz (107 kg)  12/21/23 231 lb 3.2 oz (104.9 kg)  09/29/23 230 lb (104.3 kg)    Physical Exam  Constitutional:      General: She is not in acute distress.    Appearance: Normal appearance. She is obese. She is not ill-appearing, toxic-appearing or diaphoretic.  HENT:     Head: Normocephalic.  Cardiovascular:     Rate and Rhythm: Normal rate and regular rhythm.  Pulmonary:     Effort: Pulmonary effort is normal.     Breath sounds: Normal breath sounds.  Musculoskeletal:        General: Normal range of motion.  Neurological:     General: No focal deficit present.     Mental Status: She is alert and oriented to person, place, and time. Mental status is at baseline.  Psychiatric:        Mood and Affect: Mood normal.        Behavior: Behavior normal.        Thought Content: Thought content normal.        Judgment: Judgment normal.           Assessment & Plan:  Mixed  hyperlipidemia Assessment & Plan: Start lovastatin we will repeat cholesterol levels in three months   If s/e will consider repatha  Orders: -     Ozempic (0.25 or 0.5 MG/DOSE); Inject 0.25 mg into the skin once a week.  Dispense: 3 mL; Refill: 0  Heartburn Assessment & Plan: Trial omeprazole 20 mg once daily for two weeks for cough  Try to decrease and or avoid spicy foods, fried fatty foods, and also caffeine and chocolate as these can increase heartburn symptoms.    Orders: -     Omeprazole; Take 1 capsule (20 mg total) by mouth daily for 14 days.  Dispense: 14 capsule; Refill: 0  Controlled type 2 diabetes mellitus without complication, without long-term current use of insulin (HCC) Assessment & Plan: New dx  Start metformin 500 mg once daily   Will try to get ozempic approved.  The beneficiary does not have any FDA labeled contraindications to the requested agent including pregnancy, lactation, h/o medullary thyroid cancer or multiple endocrine neoplasia type II.    Orders: -     Ozempic (0.25 or 0.5 MG/DOSE); Inject 0.25 mg into the skin once a week.  Dispense: 3 mL; Refill: 0 -     metFORMIN HCl; Take 1 tablet (500 mg total) by mouth daily.  Dispense: 90 tablet; Refill: 3  Hypothyroidism due to Hashimoto's thyroiditis Assessment & Plan: Continue levothyroxine 88 mcg once daily    Essential hypertension Assessment & Plan: Continue losartan 50 mg once daily  Low sodium diet to follow  Have pt check blood pressures daily x one week and report back   Orders: -     Ozempic (0.25 or 0.5 MG/DOSE); Inject 0.25 mg into the skin once a week.  Dispense: 3 mL; Refill: 0  Obesity (BMI 35.0-39.9 without comorbidity) Assessment & Plan: Pt advised to work on diet and exercise as tolerated   Orders: -     Ozempic (0.25 or 0.5 MG/DOSE); Inject 0.25 mg into the skin once a week.  Dispense: 3 mL; Refill: 0     Return in about 3 months (around 04/30/2024) for f/u  diabetes.  Mort Sawyers, MSN, APRN, FNP-C [facility]

## 2024-02-01 NOTE — Assessment & Plan Note (Signed)
Pt advised to work on diet and exercise as tolerated

## 2024-02-01 NOTE — Assessment & Plan Note (Signed)
Continue losartan 50 mg once daily  Low sodium diet to follow  Have pt check blood pressures daily x one week and report back

## 2024-02-02 ENCOUNTER — Telehealth: Payer: Self-pay

## 2024-02-02 ENCOUNTER — Other Ambulatory Visit (HOSPITAL_COMMUNITY): Payer: Self-pay

## 2024-02-02 NOTE — Telephone Encounter (Signed)
Pharmacy Patient Advocate Encounter   Received notification from Pt Calls Messages that prior authorization for [facility] is required/requested.   Insurance verification completed.   The patient is insured through [employer] .   Per test claim: Refill too soon. PA is not needed at this time. Medication was filled 02/01/24. Next eligible fill date is 02/26/24.

## 2024-02-02 NOTE — Telephone Encounter (Signed)
Per test claim: Refill too soon. PA is not needed at this time. Medication was filled 02/01/24. Next eligible fill date is 02/26/24.

## 2024-03-29 ENCOUNTER — Other Ambulatory Visit: Payer: Self-pay | Admitting: Family

## 2024-03-29 DIAGNOSIS — E782 Mixed hyperlipidemia: Secondary | ICD-10-CM

## 2024-03-29 DIAGNOSIS — I10 Essential (primary) hypertension: Secondary | ICD-10-CM

## 2024-03-29 DIAGNOSIS — E669 Obesity, unspecified: Secondary | ICD-10-CM

## 2024-03-29 DIAGNOSIS — E119 Type 2 diabetes mellitus without complications: Secondary | ICD-10-CM

## 2024-03-30 ENCOUNTER — Encounter: Payer: Self-pay | Admitting: Family

## 2024-03-31 ENCOUNTER — Encounter: Payer: Self-pay | Admitting: Family

## 2024-04-14 ENCOUNTER — Other Ambulatory Visit: Payer: Self-pay | Admitting: Family

## 2024-04-14 DIAGNOSIS — E119 Type 2 diabetes mellitus without complications: Secondary | ICD-10-CM

## 2024-04-14 DIAGNOSIS — E782 Mixed hyperlipidemia: Secondary | ICD-10-CM

## 2024-04-14 NOTE — Progress Notes (Unsigned)
 A1c

## 2024-04-25 ENCOUNTER — Other Ambulatory Visit: Payer: 59

## 2024-05-02 ENCOUNTER — Ambulatory Visit: Payer: 59 | Admitting: Family

## 2024-05-04 ENCOUNTER — Encounter: Payer: Self-pay | Admitting: Family

## 2024-05-04 NOTE — Telephone Encounter (Signed)
 Still unable to reach pt by phone and left v/m for pt to call [phone] and if after 5 pm will still be able to speak with nurse for triage. Sending to [facility] triage.

## 2024-05-04 NOTE — Telephone Encounter (Signed)
 Unable to reach pt at any contact # and couldnot reach pts husband. Left v/m requesting [phone] and left message on my chaart. Sending to [facility] trige and [place] pool

## 2024-05-05 NOTE — Telephone Encounter (Signed)
 Left v/m requesting pt to call [phone] for more info and pt to be triaged.if after 5 pm  pt can speak with nurse on the phone. Sending note to [facility] triage and FYI to Margarie Shay NP who is in office and [place] pool.

## 2024-05-05 NOTE — Telephone Encounter (Signed)
 Left v/m and sent my chart note requesting pt call [phone] for triage.

## 2024-05-23 ENCOUNTER — Other Ambulatory Visit (INDEPENDENT_AMBULATORY_CARE_PROVIDER_SITE_OTHER)

## 2024-05-23 DIAGNOSIS — E119 Type 2 diabetes mellitus without complications: Secondary | ICD-10-CM | POA: Diagnosis not present

## 2024-05-23 DIAGNOSIS — E782 Mixed hyperlipidemia: Secondary | ICD-10-CM

## 2024-05-23 LAB — LIPID PANEL
Cholesterol: 216 mg/dL — ABNORMAL HIGH (ref 0–200)
HDL: 51.4 mg/dL (ref >39.00)
LDL Cholesterol: 103 mg/dL — ABNORMAL HIGH (ref 0–99)
NonHDL: 164.79
Total CHOL/HDL Ratio: 4
Triglycerides: 307 mg/dL — ABNORMAL HIGH (ref 0.0–149.0)
VLDL: 61.4 mg/dL — ABNORMAL HIGH (ref 0.0–40.0)

## 2024-05-23 LAB — MICROALBUMIN / CREATININE URINE RATIO
Creatinine,U: 127.4 mg/dL
Microalb Creat Ratio: UNDETERMINED mg/g (ref 0.0–30.0)
Microalb, Ur: <0.7 mg/dL

## 2024-05-23 LAB — HEMOGLOBIN A1C: Hgb A1c MFr Bld: 6.2 % (ref 4.6–6.5)

## 2024-05-24 ENCOUNTER — Ambulatory Visit: Payer: Self-pay | Admitting: Family

## 2024-05-24 DIAGNOSIS — E782 Mixed hyperlipidemia: Secondary | ICD-10-CM

## 2024-05-24 DIAGNOSIS — E119 Type 2 diabetes mellitus without complications: Secondary | ICD-10-CM

## 2024-05-24 DIAGNOSIS — Z789 Other specified health status: Secondary | ICD-10-CM

## 2024-05-24 DIAGNOSIS — M791 Myalgia, unspecified site: Secondary | ICD-10-CM

## 2024-05-29 DIAGNOSIS — Z789 Other specified health status: Secondary | ICD-10-CM | POA: Insufficient documentation

## 2024-05-29 DIAGNOSIS — M791 Myalgia, unspecified site: Secondary | ICD-10-CM | POA: Insufficient documentation

## 2024-05-29 MED ORDER — REPATHA SURECLICK 140 MG/ML ~~LOC~~ SOAJ: 140.0000 mg | SUBCUTANEOUS | 2 refills | Status: DC

## 2024-05-29 NOTE — Telephone Encounter (Signed)
 Can we please start prior auth for repatha? Failed lovastatin , rosuvastatin , simvastatin .

## 2024-05-30 ENCOUNTER — Telehealth: Payer: Self-pay

## 2024-05-30 ENCOUNTER — Ambulatory Visit (INDEPENDENT_AMBULATORY_CARE_PROVIDER_SITE_OTHER): Admitting: Family

## 2024-05-30 ENCOUNTER — Encounter: Payer: Self-pay | Admitting: Family

## 2024-05-30 ENCOUNTER — Other Ambulatory Visit (HOSPITAL_COMMUNITY): Payer: Self-pay

## 2024-05-30 VITALS — BP 130/78 | HR 97 | Temp 98.7°F | Ht 65.0 in | Wt 230.0 lb

## 2024-05-30 DIAGNOSIS — I10 Essential (primary) hypertension: Secondary | ICD-10-CM

## 2024-05-30 DIAGNOSIS — E063 Autoimmune thyroiditis: Secondary | ICD-10-CM | POA: Diagnosis not present

## 2024-05-30 DIAGNOSIS — E119 Type 2 diabetes mellitus without complications: Secondary | ICD-10-CM

## 2024-05-30 DIAGNOSIS — Z7985 Long-term (current) use of injectable non-insulin antidiabetic drugs: Secondary | ICD-10-CM | POA: Diagnosis not present

## 2024-05-30 DIAGNOSIS — E782 Mixed hyperlipidemia: Secondary | ICD-10-CM | POA: Diagnosis not present

## 2024-05-30 DIAGNOSIS — E669 Obesity, unspecified: Secondary | ICD-10-CM

## 2024-05-30 DIAGNOSIS — Z6838 Body mass index (BMI) 38.0-38.9, adult: Secondary | ICD-10-CM

## 2024-05-30 DIAGNOSIS — Z1211 Encounter for screening for malignant neoplasm of colon: Secondary | ICD-10-CM

## 2024-05-30 MED ORDER — OZEMPIC (0.25 OR 0.5 MG/DOSE) 2 MG/3ML ~~LOC~~ SOPN: 0.2500 mg | PEN_INJECTOR | SUBCUTANEOUS | Status: DC

## 2024-05-30 MED ORDER — EZETIMIBE 10 MG PO TABS: 10.0000 mg | ORAL_TABLET | Freq: Every day | ORAL | 3 refills | Status: DC

## 2024-05-30 NOTE — Assessment & Plan Note (Signed)
 Continue losartan 50 mg once daily  Low sodium diet to follow  Have pt check blood pressures daily x one week and report back

## 2024-05-30 NOTE — Assessment & Plan Note (Signed)
 Pt would like to hold off of repatha for now.  Will try zetia and advised pt to start otc fish oils  We wil repeat at f/u

## 2024-05-30 NOTE — Assessment & Plan Note (Signed)
 Continue levothyroxine 88 mcg once daily.

## 2024-05-30 NOTE — Telephone Encounter (Signed)
 Pharmacy Patient Advocate Encounter  Received notification from [facility] that Prior Authorization for Repatha [employer] has been DENIED.  Full denial letter will be uploaded to the media tab. See denial reason below. LDL from 05/23/24 was 103.  PA #/Case ID/Reference #: [id number]

## 2024-05-30 NOTE — Telephone Encounter (Signed)
 Pharmacy Patient Advocate Encounter   Received notification from Patient Pharmacy that prior authorization for Repatha [employer] is required/requested.   Insurance verification completed.   The patient is insured through [facility] .   Per test claim: PA required; PA submitted to above mentioned insurance via CoverMyMeds Key/confirmation #/EOC [id number] Status is pending

## 2024-05-30 NOTE — Assessment & Plan Note (Signed)
 Pt to consider ozempic  discussed in detail.  Pt advised to:  ork on diabetic diet and exercise as tolerated. Yearly foot exam, and annual eye exam.

## 2024-05-30 NOTE — Patient Instructions (Signed)
  Start vitamin D3 2000 I/U once daily  Start vitamin B12 1000 mcg once daily.

## 2024-05-30 NOTE — Progress Notes (Signed)
 Established Patient Office Visit  Subjective:      CC:  Chief Complaint  Patient presents with   Medical Management of Chronic Issues    HPI: Katherine Callahan is a 48 y.o. female presenting on 05/30/2024 for Medical Management of Chronic Issues  HLD: last visit started on lovastatin  but it gave her knee arthralgias so she stopped. She also has tried simvastatin  and rosuvastatin  in the past but also had myalgias. Her goal is to start an exercise routine and really work on her diet.   DM2: did pick up ozempic  but has not yet started yet but is going to consider starting. She is concerned for side effects. She has had improvement with diet alone from 6.5 to 6.2   HTN: a lot of stress at work, so has noticed increases but only really related to stress.       Social history:  Relevant past medical, surgical, family and social history reviewed and updated as indicated. Interim medical history since our last visit reviewed.  Allergies and medications reviewed and updated.  DATA REVIEWED: CHART IN EPIC     ROS: Negative unless specifically indicated above in HPI.    Current Outpatient Medications:    ezetimibe (ZETIA) 10 MG tablet, Take 1 tablet (10 mg total) by mouth daily., Disp: 90 tablet, Rfl: 3   levothyroxine  (SYNTHROID ) 88 MCG tablet, Take 1 tablet (88 mcg total) by mouth daily., Disp: 90 tablet, Rfl: 3   losartan  (COZAAR ) 50 MG tablet, Take 1 tablet (50 mg total) by mouth daily., Disp: 90 tablet, Rfl: 1   tranexamic acid  (LYSTEDA ) 650 MG TABS tablet, Take 2 tablets (1,300 mg total) by mouth 3 (three) times daily. Take during menses for a maximum of five days, Disp: 30 tablet, Rfl: 2   omeprazole  (PRILOSEC) 20 MG capsule, Take 1 capsule (20 mg total) by mouth daily for 14 days., Disp: 14 capsule, Rfl: 0   Semaglutide ,0.25 or 0.5MG /DOS, (OZEMPIC , 0.25 OR 0.5 MG/DOSE,) 2 MG/3ML SOPN, Inject 0.25 mg into the skin once a week., Disp: , Rfl:       Objective:     BP 130/78    Pulse 97   Temp 98.7 F (37.1 C) (Temporal)   Ht 5\' 5"  (1.651 m)   Wt 230 lb (104.3 kg)   LMP 04/29/2024 (Approximate)   SpO2 95%   BMI 38.27 kg/m   Wt Readings from Last 3 Encounters:  05/30/24 230 lb (104.3 kg)  02/01/24 235 lb 12.8 oz (107 kg)  12/21/23 231 lb 3.2 oz (104.9 kg)    Physical Exam Vitals reviewed.  Constitutional:      General: She is not in acute distress.    Appearance: Normal appearance. She is obese. She is not ill-appearing, toxic-appearing or diaphoretic.  HENT:     Head: Normocephalic.  Cardiovascular:     Rate and Rhythm: Normal rate and regular rhythm.  Pulmonary:     Effort: Pulmonary effort is normal.     Breath sounds: Normal breath sounds.  Musculoskeletal:        General: Normal range of motion.  Neurological:     General: No focal deficit present.     Mental Status: She is alert and oriented to person, place, and time. Mental status is at baseline.  Psychiatric:        Mood and Affect: Mood normal.        Behavior: Behavior normal.        Thought Content: Thought content  normal.        Judgment: Judgment normal.           Assessment & Plan:  Screening for colon cancer -     Ambulatory referral to Gastroenterology  Controlled type 2 diabetes mellitus without complication, without long-term current use of insulin (HCC) Assessment & Plan: Pt to consider ozempic  discussed in detail.  Pt advised to:  ork on diabetic diet and exercise as tolerated. Yearly foot exam, and annual eye exam.    Orders: -     Ozempic  (0.25 or 0.5 MG/DOSE); Inject 0.25 mg into the skin once a week.  Hypothyroidism due to Hashimoto's thyroiditis Assessment & Plan: Continue levothyroxine  88 mcg once daily    Mixed hyperlipidemia Assessment & Plan: Pt would like to hold off of repatha for now.  Will try zetia and advised pt to start otc fish oils  We wil repeat at f/u   Orders: -     Ozempic  (0.25 or 0.5 MG/DOSE); Inject 0.25 mg into the skin  once a week. -     Ezetimibe; Take 1 tablet (10 mg total) by mouth daily.  Dispense: 90 tablet; Refill: 3  Essential hypertension Assessment & Plan: Continue losartan  50 mg once daily  Low sodium diet to follow  Have pt check blood pressures daily x one week and report back   Orders: -     Ozempic  (0.25 or 0.5 MG/DOSE); Inject 0.25 mg into the skin once a week.  Obesity (BMI 35.0-39.9 without comorbidity) -     Ozempic  (0.25 or 0.5 MG/DOSE); Inject 0.25 mg into the skin once a week.     Return in about 6 months (around 11/29/2024) for f/u CPE.  Felicita Horns, MSN, APRN, FNP-C [facility]

## 2024-06-12 ENCOUNTER — Other Ambulatory Visit: Payer: Self-pay

## 2024-06-12 ENCOUNTER — Telehealth: Payer: Self-pay

## 2024-06-12 DIAGNOSIS — Z1211 Encounter for screening for malignant neoplasm of colon: Secondary | ICD-10-CM

## 2024-06-12 MED ORDER — NA SULFATE-K SULFATE-MG SULF 17.5-3.13-1.6 GM/177ML PO SOLN: 1.0000 | Freq: Once | ORAL | 0 refills | Status: AC

## 2024-06-12 NOTE — Telephone Encounter (Signed)
 Gastroenterology Pre-Procedure Review  Request Date: 07/25/24 Requesting Physician: Dr. Jinny  PATIENT REVIEW QUESTIONS: The patient responded to the following health history questions as indicated:    1. Are you having any GI issues? no 2. Do you have a personal history of Polyps? no 3. Do you have a family history of Colon Cancer or Polyps? no 4. Diabetes Mellitus? Prediabetic.  Prescribed semaglutide  but not currently taking. 5. Joint replacements in the past 12 months?no 6. Major health problems in the past 3 months?no 7. Any artificial heart valves, MVP, or defibrillator?no    MEDICATIONS & ALLERGIES:    Patient reports the following regarding taking any anticoagulation/antiplatelet therapy:   Plavix, Coumadin, Eliquis, Xarelto, Lovenox, Pradaxa, Brilinta, or Effient? no Aspirin? no  Patient confirms/reports the following medications:  Current Outpatient Medications  Medication Sig Dispense Refill   ezetimibe  (ZETIA ) 10 MG tablet Take 1 tablet (10 mg total) by mouth daily. 90 tablet 3   levothyroxine  (SYNTHROID ) 88 MCG tablet Take 1 tablet (88 mcg total) by mouth daily. 90 tablet 3   losartan  (COZAAR ) 50 MG tablet Take 1 tablet (50 mg total) by mouth daily. 90 tablet 1   omeprazole  (PRILOSEC) 20 MG capsule Take 1 capsule (20 mg total) by mouth daily for 14 days. 14 capsule 0   tranexamic acid  (LYSTEDA ) 650 MG TABS tablet Take 2 tablets (1,300 mg total) by mouth 3 (three) times daily. Take during menses for a maximum of five days 30 tablet 2   Semaglutide ,0.25 or 0.5MG /DOS, (OZEMPIC , 0.25 OR 0.5 MG/DOSE,) 2 MG/3ML SOPN Inject 0.25 mg into the skin once a week. (Patient not taking: Reported on 06/12/2024)     No current facility-administered medications for this visit.    Patient confirms/reports the following allergies:  Allergies  Allergen Reactions   Simvastatin  Other (See Comments)    myalgia   Aspirin Other (See Comments)    Nose bleed   Codeine Nausea Only   Crestor   [Rosuvastatin ] Other (See Comments)    myalgias   Lovastatin  Other (See Comments)    myalgias   Amlodipine  Other (See Comments)    Ankle swelling at 10 mg dose   Penicillin G Rash   Sulfa Antibiotics Rash    No orders of the defined types were placed in this encounter.   AUTHORIZATION INFORMATION Primary Insurance: 1D#: Group #:  Secondary Insurance: 1D#: Group #:  SCHEDULE INFORMATION: Date: 07/25/24 Time: Location: [facility]

## 2024-07-20 ENCOUNTER — Telehealth: Payer: Self-pay

## 2024-07-20 MED ORDER — NA SULFATE-K SULFATE-MG SULF 17.5-3.13-1.6 GM/177ML PO SOLN: 354.0000 mL | Freq: Once | ORAL | 0 refills | Status: AC

## 2024-07-20 NOTE — Addendum Note (Signed)
 Addended by: JODIE HEADINGS on: 07/20/2024 01:36 PM   Modules accepted: Orders

## 2024-07-20 NOTE — Telephone Encounter (Signed)
 Patient called requesting to get her procedure date to be rescheduled from 07/25/2024 to 11/07/2024. I then called the endo unit and spoke with Trish to let her know of the change. Prescription was also sent again.

## 2024-09-07 ENCOUNTER — Telehealth: Payer: Self-pay

## 2024-09-07 NOTE — Telephone Encounter (Signed)
 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  Colonoscopy has been rescheduled to 11/20/24 with Dr. Jinny.  Endo notified of date change.  Referral updated.  Instructions updated.  Thanks,  [place], CMA

## 2024-09-20 ENCOUNTER — Encounter: Payer: Self-pay | Admitting: Family

## 2024-09-20 DIAGNOSIS — I10 Essential (primary) hypertension: Secondary | ICD-10-CM

## 2024-09-21 MED ORDER — LOSARTAN POTASSIUM 50 MG PO TABS: 50.0000 mg | ORAL_TABLET | Freq: Every day | ORAL | 1 refills | Status: AC

## 2024-11-08 ENCOUNTER — Telehealth: Payer: Self-pay | Admitting: Family

## 2024-11-08 NOTE — Telephone Encounter (Signed)
 Per tabitha cancel lab appt labs with be done at appointment on 12/01/24

## 2024-11-15 ENCOUNTER — Encounter: Payer: Self-pay | Admitting: Gastroenterology

## 2024-11-20 ENCOUNTER — Ambulatory Visit: Admitting: Anesthesiology

## 2024-11-20 ENCOUNTER — Encounter: Payer: Self-pay | Admitting: Gastroenterology

## 2024-11-20 ENCOUNTER — Encounter
Admission: RE | Disposition: A | Discharge status: Home / Self Care | Payer: Self-pay | Source: Home / Self Care | Attending: Gastroenterology

## 2024-11-20 ENCOUNTER — Ambulatory Visit
Admission: RE | Admit: 2024-11-20 | Discharge: 2024-11-20 | Disposition: A | Discharge status: Home / Self Care | Attending: Gastroenterology | Admitting: Gastroenterology

## 2024-11-20 DIAGNOSIS — Z1211 Encounter for screening for malignant neoplasm of colon: Secondary | ICD-10-CM

## 2024-11-20 DIAGNOSIS — K635 Polyp of colon: Secondary | ICD-10-CM | POA: Diagnosis not present

## 2024-11-20 DIAGNOSIS — D120 Benign neoplasm of cecum: Secondary | ICD-10-CM | POA: Diagnosis not present

## 2024-11-20 DIAGNOSIS — D124 Benign neoplasm of descending colon: Secondary | ICD-10-CM

## 2024-11-20 DIAGNOSIS — K640 First degree hemorrhoids: Secondary | ICD-10-CM

## 2024-11-20 HISTORY — PX: POLYPECTOMY: SHX149

## 2024-11-20 HISTORY — PX: COLONOSCOPY: SHX5424

## 2024-11-20 HISTORY — DX: Essential (primary) hypertension: I10

## 2024-11-20 LAB — POCT PREGNANCY, URINE: Preg Test, Ur: NEGATIVE

## 2024-11-20 SURGERY — COLONOSCOPY
Anesthesia: General

## 2024-11-20 MED ORDER — PROPOFOL 10 MG/ML IV BOLUS
INTRAVENOUS | Status: DC | PRN
  Administered 2024-11-20: 100 mg via INTRAVENOUS

## 2024-11-20 MED ORDER — SODIUM CHLORIDE 0.9 % IV SOLN: INTRAVENOUS | Status: DC

## 2024-11-20 MED ORDER — PROPOFOL 1000 MG/100ML IV EMUL
INTRAVENOUS | Status: AC
  Filled 2024-11-20: qty 100

## 2024-11-20 MED ORDER — PROPOFOL 500 MG/50ML IV EMUL
INTRAVENOUS | Status: DC | PRN
  Administered 2024-11-20: 150 ug/kg/min via INTRAVENOUS

## 2024-11-20 MED ORDER — ONDANSETRON HCL 4 MG/2ML IJ SOLN
INTRAMUSCULAR | Status: AC
  Filled 2024-11-20: qty 2

## 2024-11-20 MED ORDER — ONDANSETRON HCL 4 MG/2ML IJ SOLN
INTRAMUSCULAR | Status: DC | PRN
  Administered 2024-11-20: 4 mg via INTRAVENOUS

## 2024-11-20 NOTE — Anesthesia Preprocedure Evaluation (Signed)
 Anesthesia Evaluation  Patient identified by MRN, date of birth, ID band Patient awake    Reviewed: Allergy & Precautions, NPO status , Patient's Chart, lab work & pertinent test results  History of Anesthesia Complications (+) PONV and history of anesthetic complications  Airway Mallampati: III  TM Distance: <3 FB Neck ROM: full    Dental  (+) Chipped   Pulmonary neg pulmonary ROS, neg shortness of breath   Pulmonary exam normal        Cardiovascular Exercise Tolerance: Good hypertension, (-) angina Normal cardiovascular exam     Neuro/Psych negative neurological ROS  negative psych ROS   GI/Hepatic Neg liver ROS,GERD  ,,  Endo/Other  diabetes, Type 2Hypothyroidism    Renal/GU negative Renal ROS  negative genitourinary   Musculoskeletal   Abdominal   Peds  Hematology negative hematology ROS (+)   Anesthesia Other Findings Past Medical History: No date: High cholesterol No date: Hyperlipemia No date: Hypertension No date: Increased risk of breast cancer No date: Pre-diabetes No date: Vitamin D  deficiency  Past Surgical History: 2003: BREAST BIOPSY; Left     Comment:  neg  BMI    Body Mass Index: 37.66 kg/m      Reproductive/Obstetrics negative OB ROS                              Anesthesia Physical Anesthesia Plan  ASA: 3  Anesthesia Plan: General   Post-op Pain Management:    Induction: Intravenous  PONV Risk Score and Plan: Propofol  infusion and TIVA  Airway Management Planned: Natural Airway and Nasal Cannula  Additional Equipment:   Intra-op Plan:   Post-operative Plan:   Informed Consent: I have reviewed the patients History and Physical, chart, labs and discussed the procedure including the risks, benefits and alternatives for the proposed anesthesia with the patient or authorized representative who has indicated his/her understanding and acceptance.      Dental Advisory Given  Plan Discussed with: Anesthesiologist, CRNA and Surgeon  Anesthesia Plan Comments: (Patient consented for risks of anesthesia including but not limited to:  - adverse reactions to medications - risk of airway placement if required - damage to eyes, teeth, lips or other oral mucosa - nerve damage due to positioning  - sore throat or hoarseness - Damage to heart, brain, nerves, lungs, other parts of body or loss of life  Patient voiced understanding and assent.)        Anesthesia Quick Evaluation

## 2024-11-20 NOTE — Transfer of Care (Signed)
 Immediate Anesthesia Transfer of Care Note  Patient: Katherine Callahan  Procedure(s) Performed: COLONOSCOPY  Patient Location: PACU  Anesthesia Type:General  Level of Consciousness: awake and sedated  Airway & Oxygen Therapy: Patient Spontanous Breathing and Patient connected to face mask oxygen  Post-op Assessment: Report given to RN and Post -op Vital signs reviewed and stable  Post vital signs: Reviewed and stable  Last Vitals:  Vitals Value Taken Time  BP    Temp    Pulse    Resp    SpO2      Last Pain:  Vitals:   11/20/24 0711  TempSrc: Temporal         Complications: There were no known notable events for this encounter.

## 2024-11-20 NOTE — H&P (Signed)
 Rogelia Copping, MD [facility] [address], [address] [place], [place] [address] Phone: [phone] Fax : [phone]  Primary Care Physician:  Corwin Antu, FNP Primary Gastroenterologist:  Dr. Copping  Pre-Procedure History & Physical: HPI:  Katherine Callahan is a 48 y.o. female is here for a screening colonoscopy.   Past Medical History:  Diagnosis Date   High cholesterol    Hyperlipemia    Hypertension    Increased risk of breast cancer    Pre-diabetes    Vitamin D  deficiency     Past Surgical History:  Procedure Laterality Date   BREAST BIOPSY Left 2003   neg    Prior to Admission medications   Medication Sig Start Date End Date Taking? Authorizing Provider  levothyroxine  (SYNTHROID ) 88 MCG tablet Take 1 tablet (88 mcg total) by mouth daily. 12/21/23  Yes Dugal, Tabitha, FNP  losartan  (COZAAR ) 50 MG tablet Take 1 tablet (50 mg total) by mouth daily. 09/21/24  Yes Dugal, Antu, FNP  lovastatin  (ALTOPREV ) 20 MG 24 hr tablet Take 20 mg by mouth at bedtime.   Yes [provider]  ezetimibe  (ZETIA ) 10 MG tablet Take 1 tablet (10 mg total) by mouth daily. 05/30/24   Corwin Antu, FNP  omeprazole  (PRILOSEC) 20 MG capsule Take 1 capsule (20 mg total) by mouth daily for 14 days. 02/01/24 06/12/24  Dugal, Tabitha, FNP  Semaglutide ,0.25 or 0.5MG /DOS, (OZEMPIC , 0.25 OR 0.5 MG/DOSE,) 2 MG/3ML SOPN Inject 0.25 mg into the skin once a week. Patient not taking: Reported on 11/20/2024 05/30/24   Dugal, Tabitha, FNP  tranexamic acid  (LYSTEDA ) 650 MG TABS tablet Take 2 tablets (1,300 mg total) by mouth 3 (three) times daily. Take during menses for a maximum of five days Patient not taking: Reported on 11/20/2024 07/14/22   Connell Davies, MD    Allergies as of 06/12/2024 - Review Complete 06/12/2024  Allergen Reaction Noted   Simvastatin  Other (See Comments) 01/23/2024   Aspirin Other (See Comments) 12/09/2015   Codeine Nausea Only 12/09/2015   Crestor  [rosuvastatin ] Other (See Comments)  10/13/2022   Lovastatin  Other (See Comments) 05/29/2024   Amlodipine  Other (See Comments) 04/09/2023   Penicillin g Rash 01/24/2015   Sulfa antibiotics Rash 12/09/2015    Family History  Problem Relation Age of Onset   Hypertension Mother    Breast cancer Mother 49   Heart disease Father 61       MI   Heart attack Father    Pancreatic cancer Maternal Grandmother    Mental illness Maternal Grandfather    Stroke Paternal Grandfather        49's   Breast cancer Other    Ovarian cancer Neg Hx    Colon cancer Neg Hx     Social History   Socioeconomic History   Marital status: Married    Spouse name: Not on file   Number of children: 3   Years of education: Not on file   Highest education level: Bachelor's degree (e.g., BA, AB, BS)  Occupational History   Occupation: runner, broadcasting/film/video    Comment: [employer] - elementary school in [place]  Tobacco Use   Smoking status: Never   Smokeless tobacco: Never  Vaping Use   Vaping status: Never Used  Substance and Sexual Activity   Alcohol use: No   Drug use: No   Sexual activity: Yes    Partners: Male    Birth control/protection: Other-see comments    Comment: husband-vasectomy  Other Topics Concern   Not on file  Social History Narrative   Not on file   Social Drivers of Health   Financial Resource Strain: Low Risk  (01/31/2024)   Overall Financial Resource Strain (CARDIA)    Difficulty of Paying Living Expenses: Not hard at all  Food Insecurity: No Food Insecurity (01/31/2024)   Hunger Vital Sign    Worried About Running Out of Food in the Last Year: Never true    Ran Out of Food in the Last Year: Never true  Transportation Needs: No Transportation Needs (01/31/2024)   PRAPARE - Administrator, Civil Service (Medical): No    Lack of Transportation (Non-Medical): No  Physical Activity: Unknown (01/31/2024)   Exercise Vital Sign    Days of Exercise per Week: 0 days    Minutes of Exercise per Session: Not on file  Stress:  No Stress Concern Present (01/31/2024)   [employer] of Occupational Health - Occupational Stress Questionnaire    Feeling of Stress : Not at all  Social Connections: Socially Integrated (01/31/2024)   Social Connection and Isolation Panel    Frequency of Communication with Friends and Family: More than three times a week    Frequency of Social Gatherings with Friends and Family: Twice a week    Attends Religious Services: More than 4 times per year    Active Member of [employer] or Organizations: Yes    Attends Engineer, Structural: More than 4 times per year    Marital Status: Married  Catering Manager Violence: Not on file    Review of Systems: See HPI, otherwise negative ROS  Physical Exam: BP (!) 168/99   Pulse (!) 103   Temp 98 F (36.7 C) (Temporal)   Resp 18   Wt 102.6 kg   SpO2 98%   BMI 37.66 kg/m  General:   Alert,  pleasant and cooperative in NAD Head:  Normocephalic and atraumatic. Neck:  Supple; no masses or thyromegaly. Lungs:  Clear throughout to auscultation.    Heart:  Regular rate and rhythm. Abdomen:  Soft, nontender and nondistended. Normal bowel sounds, without guarding, and without rebound.   Neurologic:  Alert and  oriented x4;  grossly normal neurologically.  Impression/Plan: Katherine Callahan is now here to undergo a screening colonoscopy.  Risks, benefits, and alternatives regarding colonoscopy have been reviewed with the patient.  Questions have been answered.  All parties agreeable.

## 2024-11-20 NOTE — Anesthesia Postprocedure Evaluation (Signed)
 Anesthesia Post Note  Patient: Katherine Callahan  Procedure(s) Performed: COLONOSCOPY POLYPECTOMY, INTESTINE  Patient location during evaluation: Endoscopy Anesthesia Type: General Level of consciousness: awake and alert Pain management: pain level controlled Vital Signs Assessment: post-procedure vital signs reviewed and stable Respiratory status: spontaneous breathing, nonlabored ventilation and respiratory function stable Cardiovascular status: blood pressure returned to baseline and stable Postop Assessment: no apparent nausea or vomiting Anesthetic complications: no   There were no known notable events for this encounter.   Last Vitals:  Vitals:   11/20/24 0802 11/20/24 0812  BP: 123/72 133/82  Pulse: 98 86  Resp: 16 20  Temp: 36.5 C   SpO2: 98% 100%    Last Pain:  Vitals:   11/20/24 0812  TempSrc:   PainSc: 0-No pain                 Fairy POUR Fleetwood Pierron

## 2024-11-20 NOTE — Op Note (Signed)
 [facility] Gastroenterology Patient Name: Katherine Callahan Procedure Date: 11/20/2024 7:27 AM MRN: [id number] Account #: [id number] Date of Birth: [birth year] Admit Type: Outpatient Age: 48 Room: [facility] ENDO ROOM 4 Gender: Female Note Status: Finalized Instrument Name: Colon Scope [id number] Procedure:             Colonoscopy Indications:           Screening for colorectal malignant neoplasm Providers:             Rogelia Copping MD, MD Referring MD:          [name] (Referring MD) Medicines:             Propofol per Anesthesia Complications:         No immediate complications. Procedure:             Pre-Anesthesia Assessment:                        - Prior to the procedure, a History and Physical was                         performed, and patient medications and allergies were                         reviewed. The patient's tolerance of previous                         anesthesia was also reviewed. The risks and benefits                         of the procedure and the sedation options and risks                         were discussed with the patient. All questions were                         answered, and informed consent was obtained. Prior                         Anticoagulants: The patient has taken no anticoagulant                         or antiplatelet agents. ASA Grade Assessment: II - A                         patient with mild systemic disease. After reviewing                         the risks and benefits, the patient was deemed in                         satisfactory condition to undergo the procedure.                        After obtaining informed consent, the colonoscope was                         passed under direct vision. Throughout the procedure,  the patient's blood pressure, pulse, and oxygen                         saturations were monitored continuously. The                         Colonoscope was introduced through the anus  and                         advanced to the the cecum, identified by appendiceal                         orifice and ileocecal valve. The colonoscopy was                         performed without difficulty. The patient tolerated                         the procedure well. The quality of the bowel                         preparation was excellent. Findings:      The perianal and digital rectal examinations were normal.      A 4 mm polyp was found in the cecum. The polyp was sessile. The polyp       was removed with a cold snare. Resection and retrieval were complete.      A 7 mm polyp was found in the descending colon. The polyp was sessile.       The polyp was removed with a cold snare. Resection and retrieval were       complete.      Non-bleeding internal hemorrhoids were found during retroflexion. The       hemorrhoids were Grade I (internal hemorrhoids that do not prolapse). Impression:            - One 4 mm polyp in the cecum, removed with a cold                         snare. Resected and retrieved.                        - One 7 mm polyp in the descending colon, removed with                         a cold snare. Resected and retrieved.                        - Non-bleeding internal hemorrhoids. Recommendation:        - Discharge patient to home.                        - Resume previous diet.                        - Continue present medications.                        - Await pathology results.                        -  If the pathology report reveals adenomatous tissue,                         then repeat the colonoscopy for surveillance in 7                         years. Procedure Code(s):     --- Professional ---                        [phone], Colonoscopy, flexible; with removal of                         tumor(s), polyp(s), or other lesion(s) by snare                         technique Diagnosis Code(s):     --- Professional ---                        Z12.11, Encounter for  screening for malignant neoplasm                         of colon                        D12.4, Benign neoplasm of descending colon CPT copyright 2022 American Medical Association. All rights reserved. The codes documented in this report are preliminary and upon coder review may  be revised to meet current compliance requirements. Rogelia Copping MD, MD 11/20/2024 8:00:42 AM This report has been signed electronically. Number of Addenda: 0 Note Initiated On: 11/20/2024 7:27 AM Scope Withdrawal Time: 0 hours 15 minutes 20 seconds  Total Procedure Duration: 0 hours 17 minutes 49 seconds  Estimated Blood Loss:  Estimated blood loss: none.      Ms [facility]

## 2024-11-22 ENCOUNTER — Ambulatory Visit: Payer: Self-pay | Admitting: Gastroenterology

## 2024-11-22 LAB — SURGICAL PATHOLOGY

## 2024-11-23 ENCOUNTER — Other Ambulatory Visit

## 2024-11-24 ENCOUNTER — Telehealth: Payer: Self-pay | Admitting: Family

## 2024-11-24 NOTE — Telephone Encounter (Signed)
 Copied from CRM [id number]. Topic: Appointments - Scheduling Inquiry for Clinic >> Nov 24, 2024  7:41 AM Donna BRAVO wrote: Reason for CRM: patient called schedule lab appt   Labs before visit, [name] has always approved per pt  Please call patient to schedule appt  What specific labs? I just want to clarify and make sure before I get her scheduled.

## 2024-11-30 ENCOUNTER — Encounter: Admitting: Family

## 2024-12-01 ENCOUNTER — Ambulatory Visit: Admitting: Family

## 2024-12-08 ENCOUNTER — Ambulatory Visit: Admitting: Family

## 2024-12-15 ENCOUNTER — Other Ambulatory Visit: Payer: Self-pay | Admitting: Family

## 2024-12-15 DIAGNOSIS — E063 Autoimmune thyroiditis: Secondary | ICD-10-CM

## 2024-12-18 ENCOUNTER — Ambulatory Visit: Admitting: Family

## 2024-12-20 ENCOUNTER — Other Ambulatory Visit (INDEPENDENT_AMBULATORY_CARE_PROVIDER_SITE_OTHER)

## 2024-12-20 DIAGNOSIS — E782 Mixed hyperlipidemia: Secondary | ICD-10-CM | POA: Diagnosis not present

## 2024-12-20 DIAGNOSIS — E063 Autoimmune thyroiditis: Secondary | ICD-10-CM

## 2024-12-20 DIAGNOSIS — E559 Vitamin D deficiency, unspecified: Secondary | ICD-10-CM

## 2024-12-20 DIAGNOSIS — E119 Type 2 diabetes mellitus without complications: Secondary | ICD-10-CM | POA: Diagnosis not present

## 2024-12-20 LAB — LIPID PANEL
Cholesterol: 189 mg/dL (ref 28–200)
HDL: 43.8 mg/dL
LDL Cholesterol: 89 mg/dL (ref 10–99)
NonHDL: 145.56
Total CHOL/HDL Ratio: 4
Triglycerides: 284 mg/dL — ABNORMAL HIGH (ref 10.0–149.0)
VLDL: 56.8 mg/dL — ABNORMAL HIGH (ref 0.0–40.0)

## 2024-12-20 LAB — COMPREHENSIVE METABOLIC PANEL WITH GFR
ALT: 16 U/L (ref 3–35)
AST: 17 U/L (ref 5–37)
Albumin: 4.2 g/dL (ref 3.5–5.2)
Alkaline Phosphatase: 47 U/L (ref 39–117)
BUN: 11 mg/dL (ref 6–23)
CO2: 27 meq/L (ref 19–32)
Calcium: 9.2 mg/dL (ref 8.4–10.5)
Chloride: 104 meq/L (ref 96–112)
Creatinine, Ser: 0.75 mg/dL (ref 0.40–1.20)
GFR: 92.93 mL/min
Glucose, Bld: 118 mg/dL — ABNORMAL HIGH (ref 70–99)
Potassium: 4.5 meq/L (ref 3.5–5.1)
Sodium: 139 meq/L (ref 135–145)
Total Bilirubin: 0.8 mg/dL (ref 0.2–1.2)
Total Protein: 6.8 g/dL (ref 6.0–8.3)

## 2024-12-20 LAB — HEMOGLOBIN A1C: Hgb A1c MFr Bld: 6.5 % (ref 4.6–6.5)

## 2024-12-20 LAB — VITAMIN D 25 HYDROXY (VIT D DEFICIENCY, FRACTURES): VITD: 19.69 ng/mL — ABNORMAL LOW (ref 30.00–100.00)

## 2024-12-20 LAB — TSH: TSH: 8.84 u[IU]/mL — ABNORMAL HIGH (ref 0.35–5.50)

## 2024-12-22 ENCOUNTER — Ambulatory Visit: Payer: Self-pay | Admitting: Family

## 2024-12-22 DIAGNOSIS — E559 Vitamin D deficiency, unspecified: Secondary | ICD-10-CM

## 2024-12-22 DIAGNOSIS — E119 Type 2 diabetes mellitus without complications: Secondary | ICD-10-CM

## 2024-12-22 MED ORDER — CHOLECALCIFEROL 1.25 MG (50000 UT) PO TABS: 1.0000 | ORAL_TABLET | ORAL | 0 refills | Status: AC

## 2024-12-27 ENCOUNTER — Ambulatory Visit: Admitting: Family

## 2024-12-27 VITALS — BP 126/80 | HR 76 | Temp 98.5°F | Ht 65.0 in | Wt 229.6 lb

## 2024-12-27 DIAGNOSIS — E119 Type 2 diabetes mellitus without complications: Secondary | ICD-10-CM

## 2024-12-27 DIAGNOSIS — Z7984 Long term (current) use of oral hypoglycemic drugs: Secondary | ICD-10-CM

## 2024-12-27 DIAGNOSIS — F411 Generalized anxiety disorder: Secondary | ICD-10-CM

## 2024-12-27 DIAGNOSIS — E063 Autoimmune thyroiditis: Secondary | ICD-10-CM | POA: Diagnosis not present

## 2024-12-27 MED ORDER — SERTRALINE HCL 50 MG PO TABS: 50.0000 mg | ORAL_TABLET | Freq: Every day | ORAL | 3 refills | Status: AC

## 2024-12-27 NOTE — Progress Notes (Signed)
 "  Established Patient Office Visit  Subjective:      CC: No chief complaint on file.   HPI: Katherine Callahan is a 49 y.o. female presenting on 12/27/2024 for No chief complaint on file. .  Discussed the use of AI scribe software for clinical note transcription with the patient, who gave verbal consent to proceed.  History of Present Illness Katherine Callahan is a 49 year old female who presents for follow-up after abnormal blood work results.  She had not been taking her levothyroxine  medication since early December after a trip to [place], leading to abnormal thyroid  function tests. She resumed her medication after seeing the blood work results. She experiences fatigue and irregular menstrual cycles.  Her cholesterol levels are elevated, particularly triglycerides, which were higher than ever before. She has a family history of heart disease, with her mother, grandmother, and father having heart-related issues. She does not take fish oils regularly.  Her vitamin D  levels are low, and she experiences constipation when taking vitamin D  supplements, which has led her to avoid them. She has tried both over-the-counter and prescribed weekly doses, both causing similar issues.  Her A1c has increased into the diabetic range. She has been prediabetic in the past and was diagnosed with diabetes 11 months ago. She has not taken metformin , which was previously prescribed.  She restarted her medications, including lovastatin , losartan , levothyroxine , and B12 supplements, which she finds helpful for energy. She also resumed sertraline  after family noted irritability, and she believes it helps with her anxiety.  She experiences irregular menstrual cycles, with periods sometimes being debilitating. She was diagnosed with adenomyosis last year, and her periods have been inconsistent, sometimes skipping months.         Social history:  Relevant past medical, surgical, family and social history  reviewed and updated as indicated. Interim medical history since our last visit reviewed.  Allergies and medications reviewed and updated.  DATA REVIEWED: CHART IN EPIC     ROS: Negative unless specifically indicated above in HPI.   Current Medications[1]        Objective:        BP 126/80 (BP Location: Left Arm, Patient Position: Sitting, Cuff Size: Large)   Pulse 76   Temp 98.5 F (36.9 C) (Temporal)   Ht 5' 5 (1.651 m)   Wt 229 lb 9.6 oz (104.1 kg)   SpO2 98%   BMI 38.21 kg/m    Physical Exam   Wt Readings from Last 3 Encounters:  12/27/24 229 lb 9.6 oz (104.1 kg)  11/20/24 226 lb 4.8 oz (102.6 kg)  05/30/24 230 lb (104.3 kg)    Physical Exam Vitals reviewed.  Constitutional:      General: She is not in acute distress.    Appearance: Normal appearance. She is normal weight. She is not ill-appearing, toxic-appearing or diaphoretic.  HENT:     Head: Normocephalic.  Neck:     Thyroid : No thyromegaly.  Cardiovascular:     Rate and Rhythm: Normal rate and regular rhythm.  Pulmonary:     Effort: Pulmonary effort is normal.  Musculoskeletal:        General: Normal range of motion.  Neurological:     General: No focal deficit present.     Mental Status: She is alert and oriented to person, place, and time. Mental status is at baseline.  Psychiatric:        Mood and Affect: Mood normal.  Behavior: Behavior normal.        Thought Content: Thought content normal.        Judgment: Judgment normal.          Results Labs TSH (11/2024): Markedly abnormal Triglycerides (11/2024): Significantly elevated Vitamin D  (11/2024): 19 Hemoglobin A1c (11/2024): 6.5, in diabetic range  Assessment & Plan:   Assessment and Plan Assessment & Plan Hypothyroidism due to Hashimoto's thyroiditis Thyroid  function tests indicate poor control due to non-adherence to levothyroxine  since December 31st. Restarted medication one week ago. - Continue levothyroxine   88 mcg oral daily - Will repeat thyroid  function tests in early February  Type 2 diabetes mellitus A1c has increased to diabetic range, likely due to dietary indiscretions during the holiday season. Previously prediabetic. Discussed metformin  for insulin resistance and potential weight loss benefits. She prefers lifestyle modifications first. - Will repeat A1c in three months - d/w pt metformin  however pt would like to try healthy lifestyle changes x 3 months and then re evaluate   Mixed hyperlipidemia Cholesterol levels elevated, particularly triglycerides. Fasting triglycerides higher than desired. No regular fish oil supplementation. - Continue lovastatin  20 mg once daily  - Consider fish oil supplementation  Essential hypertension Blood pressure management ongoing with losartan . Sertraline  may aid in blood pressure control. - Continue losartan  50 mg oral daily - Continue sertraline  as prescribed  Obesity BMI remains high. Weight management complicated by hypothyroidism. B12 supplementation aids energy levels. - Continue B12 supplementation  Vitamin D  deficiency Vitamin D  levels low, contributing to fatigue. Constipation with vitamin D  supplementation. Discussed alternative dosing schedule. - Try vitamin D3 5000 IU twice a week  Generalized anxiety disorder Anxiety symptoms managed with sertraline . Recent increase in irritability noted by family. Restarted sertraline  a few days ago. - Continue sertraline  as prescribed  Adenomyosis of uterus Menstrual irregularities and severe pain during periods. Previous recommendation for hysterectomy. Symptoms may be exacerbated by thyroid  dysfunction. Discussed potential for menopause and surgical risks. - Discuss symptoms and management options with gynecologist         Return in about 3 months (around 03/27/2025) for f/u diabetes.     [name], MSN, APRN, FNP-C  [facility]        [1]  Current  Outpatient Medications:    Cholecalciferol  1.25 MG (50000 UT) TABS, Take 1 tablet by mouth once a week., Disp: 12 tablet, Rfl: 0   levothyroxine  (SYNTHROID ) 88 MCG tablet, Take 1 tablet (88 mcg total) by mouth daily., Disp: 90 tablet, Rfl: 3   losartan  (COZAAR ) 50 MG tablet, Take 1 tablet (50 mg total) by mouth daily., Disp: 90 tablet, Rfl: 1   lovastatin  (ALTOPREV ) 20 MG 24 hr tablet, Take 20 mg by mouth at bedtime., Disp: , Rfl:    sertraline  (ZOLOFT ) 50 MG tablet, Take 1 tablet (50 mg total) by mouth daily., Disp: 90 tablet, Rfl: 3  "

## 2024-12-29 ENCOUNTER — Ambulatory Visit: Admitting: Family

## 2025-01-10 ENCOUNTER — Other Ambulatory Visit: Payer: Self-pay | Admitting: Family

## 2025-01-10 DIAGNOSIS — E063 Autoimmune thyroiditis: Secondary | ICD-10-CM

## 2025-01-13 ENCOUNTER — Other Ambulatory Visit: Payer: Self-pay | Admitting: Family

## 2025-01-13 DIAGNOSIS — E063 Autoimmune thyroiditis: Secondary | ICD-10-CM

## 2025-01-24 ENCOUNTER — Other Ambulatory Visit

## 2025-02-20 ENCOUNTER — Other Ambulatory Visit

## 2025-03-27 ENCOUNTER — Ambulatory Visit: Admitting: Family
